# Patient Record
Sex: Female | Born: 1955 | Race: Asian | Hispanic: No | Marital: Married | Smoking: Never smoker
Health system: Southern US, Community
[De-identification: ages and names within clinical notes are randomized; demographics above are authoritative.]

## PROBLEM LIST (undated history)

## (undated) DIAGNOSIS — I10 Essential (primary) hypertension: Secondary | ICD-10-CM

## (undated) DIAGNOSIS — E785 Hyperlipidemia, unspecified: Secondary | ICD-10-CM

## (undated) DIAGNOSIS — K219 Gastro-esophageal reflux disease without esophagitis: Secondary | ICD-10-CM

## (undated) DIAGNOSIS — M810 Age-related osteoporosis without current pathological fracture: Secondary | ICD-10-CM

## (undated) DIAGNOSIS — J309 Allergic rhinitis, unspecified: Secondary | ICD-10-CM

## (undated) DIAGNOSIS — M8580 Other specified disorders of bone density and structure, unspecified site: Secondary | ICD-10-CM

## (undated) DIAGNOSIS — J342 Deviated nasal septum: Secondary | ICD-10-CM

## (undated) DIAGNOSIS — T7840XA Allergy, unspecified, initial encounter: Secondary | ICD-10-CM

## (undated) HISTORY — DX: Essential (primary) hypertension: I10

## (undated) HISTORY — DX: Allergy, unspecified, initial encounter: T78.40XA

## (undated) HISTORY — DX: Deviated nasal septum: J34.2

## (undated) HISTORY — DX: Other specified disorders of bone density and structure, unspecified site: M85.80

## (undated) HISTORY — DX: Allergic rhinitis, unspecified: J30.9

## (undated) HISTORY — DX: Gastro-esophageal reflux disease without esophagitis: K21.9

## (undated) HISTORY — DX: Hyperlipidemia, unspecified: E78.5

## (undated) HISTORY — DX: Age-related osteoporosis without current pathological fracture: M81.0

---

## 1988-11-18 HISTORY — PX: THYROIDECTOMY, PARTIAL: SHX18

## 1994-11-18 HISTORY — PX: TOTAL ABDOMINAL HYSTERECTOMY: SHX209

## 1998-01-04 ENCOUNTER — Ambulatory Visit (HOSPITAL_COMMUNITY): Admission: RE | Admit: 1998-01-04 | Discharge: 1998-01-04 | Payer: Self-pay | Admitting: Obstetrics and Gynecology

## 1998-10-04 ENCOUNTER — Other Ambulatory Visit: Admission: RE | Admit: 1998-10-04 | Discharge: 1998-10-04 | Payer: Self-pay | Admitting: Obstetrics and Gynecology

## 1999-05-15 ENCOUNTER — Encounter: Payer: Self-pay | Admitting: Obstetrics and Gynecology

## 1999-05-15 ENCOUNTER — Ambulatory Visit (HOSPITAL_COMMUNITY): Admission: RE | Admit: 1999-05-15 | Discharge: 1999-05-15 | Payer: Self-pay | Admitting: Obstetrics and Gynecology

## 1999-12-04 ENCOUNTER — Other Ambulatory Visit: Admission: RE | Admit: 1999-12-04 | Discharge: 1999-12-04 | Payer: Self-pay | Admitting: Obstetrics and Gynecology

## 2000-05-16 ENCOUNTER — Encounter: Payer: Self-pay | Admitting: Obstetrics and Gynecology

## 2000-05-16 ENCOUNTER — Ambulatory Visit (HOSPITAL_COMMUNITY): Admission: RE | Admit: 2000-05-16 | Discharge: 2000-05-16 | Payer: Self-pay | Admitting: Obstetrics and Gynecology

## 2001-05-26 ENCOUNTER — Encounter: Payer: Self-pay | Admitting: Obstetrics and Gynecology

## 2001-05-26 ENCOUNTER — Ambulatory Visit (HOSPITAL_COMMUNITY): Admission: RE | Admit: 2001-05-26 | Discharge: 2001-05-26 | Payer: Self-pay | Admitting: Obstetrics and Gynecology

## 2002-06-30 ENCOUNTER — Encounter: Payer: Self-pay | Admitting: Obstetrics and Gynecology

## 2002-06-30 ENCOUNTER — Ambulatory Visit (HOSPITAL_COMMUNITY): Admission: RE | Admit: 2002-06-30 | Discharge: 2002-06-30 | Payer: Self-pay | Admitting: Obstetrics and Gynecology

## 2003-06-24 ENCOUNTER — Ambulatory Visit (HOSPITAL_COMMUNITY): Admission: RE | Admit: 2003-06-24 | Discharge: 2003-06-24 | Payer: Self-pay | Admitting: Obstetrics and Gynecology

## 2003-06-24 ENCOUNTER — Encounter: Payer: Self-pay | Admitting: Obstetrics and Gynecology

## 2004-06-29 ENCOUNTER — Ambulatory Visit (HOSPITAL_COMMUNITY): Admission: RE | Admit: 2004-06-29 | Discharge: 2004-06-29 | Payer: Self-pay | Admitting: Obstetrics and Gynecology

## 2005-01-03 ENCOUNTER — Other Ambulatory Visit: Admission: RE | Admit: 2005-01-03 | Discharge: 2005-01-03 | Payer: Self-pay | Admitting: Obstetrics and Gynecology

## 2005-07-23 ENCOUNTER — Ambulatory Visit (HOSPITAL_COMMUNITY): Admission: RE | Admit: 2005-07-23 | Discharge: 2005-07-23 | Payer: Self-pay | Admitting: Obstetrics and Gynecology

## 2006-01-07 ENCOUNTER — Other Ambulatory Visit: Admission: RE | Admit: 2006-01-07 | Discharge: 2006-01-07 | Payer: Self-pay | Admitting: Obstetrics and Gynecology

## 2006-07-04 ENCOUNTER — Ambulatory Visit (HOSPITAL_COMMUNITY): Admission: RE | Admit: 2006-07-04 | Discharge: 2006-07-04 | Payer: Self-pay | Admitting: Obstetrics and Gynecology

## 2006-11-13 ENCOUNTER — Ambulatory Visit (HOSPITAL_COMMUNITY): Admission: RE | Admit: 2006-11-13 | Discharge: 2006-11-13 | Payer: Self-pay | Admitting: Obstetrics and Gynecology

## 2007-11-16 ENCOUNTER — Ambulatory Visit (HOSPITAL_COMMUNITY): Admission: RE | Admit: 2007-11-16 | Discharge: 2007-11-16 | Payer: Self-pay | Admitting: Obstetrics and Gynecology

## 2008-11-16 ENCOUNTER — Ambulatory Visit (HOSPITAL_COMMUNITY): Admission: RE | Admit: 2008-11-16 | Discharge: 2008-11-16 | Payer: Self-pay | Admitting: Obstetrics and Gynecology

## 2008-12-16 ENCOUNTER — Encounter: Admission: RE | Admit: 2008-12-16 | Discharge: 2008-12-16 | Payer: Self-pay | Admitting: Family Medicine

## 2009-04-04 ENCOUNTER — Encounter: Admission: RE | Admit: 2009-04-04 | Discharge: 2009-04-04 | Payer: Self-pay | Admitting: General Surgery

## 2009-06-28 ENCOUNTER — Encounter: Admission: RE | Admit: 2009-06-28 | Discharge: 2009-06-28 | Payer: Self-pay | Admitting: Endocrinology

## 2009-12-08 ENCOUNTER — Ambulatory Visit (HOSPITAL_COMMUNITY): Admission: RE | Admit: 2009-12-08 | Discharge: 2009-12-08 | Payer: Self-pay | Admitting: Obstetrics and Gynecology

## 2010-12-25 ENCOUNTER — Other Ambulatory Visit: Payer: Self-pay | Admitting: Endocrinology

## 2010-12-25 DIAGNOSIS — E049 Nontoxic goiter, unspecified: Secondary | ICD-10-CM

## 2010-12-27 ENCOUNTER — Other Ambulatory Visit (HOSPITAL_COMMUNITY): Payer: Self-pay | Admitting: Obstetrics and Gynecology

## 2010-12-27 ENCOUNTER — Ambulatory Visit
Admission: RE | Admit: 2010-12-27 | Discharge: 2010-12-27 | Disposition: A | Discharge status: Home / Self Care | Payer: 59 | Source: Ambulatory Visit | Attending: Endocrinology | Admitting: Endocrinology

## 2010-12-27 ENCOUNTER — Other Ambulatory Visit: Payer: Self-pay

## 2010-12-27 DIAGNOSIS — Z1231 Encounter for screening mammogram for malignant neoplasm of breast: Secondary | ICD-10-CM

## 2010-12-27 DIAGNOSIS — E049 Nontoxic goiter, unspecified: Secondary | ICD-10-CM

## 2011-01-10 ENCOUNTER — Other Ambulatory Visit: Payer: Self-pay | Admitting: Endocrinology

## 2011-01-10 DIAGNOSIS — E042 Nontoxic multinodular goiter: Secondary | ICD-10-CM

## 2011-01-16 ENCOUNTER — Ambulatory Visit
Admission: RE | Admit: 2011-01-16 | Discharge: 2011-01-16 | Disposition: A | Discharge status: Home / Self Care | Payer: 59 | Source: Ambulatory Visit | Attending: Endocrinology | Admitting: Endocrinology

## 2011-01-16 ENCOUNTER — Other Ambulatory Visit: Payer: Self-pay | Admitting: Interventional Radiology

## 2011-01-16 ENCOUNTER — Other Ambulatory Visit (HOSPITAL_COMMUNITY)
Admission: RE | Admit: 2011-01-16 | Discharge: 2011-01-16 | Disposition: A | Discharge status: Home / Self Care | Payer: 59 | Source: Ambulatory Visit | Attending: Interventional Radiology | Admitting: Interventional Radiology

## 2011-01-16 DIAGNOSIS — E049 Nontoxic goiter, unspecified: Secondary | ICD-10-CM | POA: Insufficient documentation

## 2011-01-16 DIAGNOSIS — E042 Nontoxic multinodular goiter: Secondary | ICD-10-CM

## 2011-01-21 ENCOUNTER — Ambulatory Visit (HOSPITAL_COMMUNITY)
Admission: RE | Admit: 2011-01-21 | Discharge: 2011-01-21 | Disposition: A | Discharge status: Home / Self Care | Payer: 59 | Source: Ambulatory Visit | Attending: Obstetrics and Gynecology | Admitting: Obstetrics and Gynecology

## 2011-01-21 DIAGNOSIS — Z1231 Encounter for screening mammogram for malignant neoplasm of breast: Secondary | ICD-10-CM

## 2011-12-24 ENCOUNTER — Other Ambulatory Visit: Payer: Self-pay | Admitting: Endocrinology

## 2011-12-24 DIAGNOSIS — E049 Nontoxic goiter, unspecified: Secondary | ICD-10-CM

## 2011-12-25 ENCOUNTER — Other Ambulatory Visit (HOSPITAL_COMMUNITY): Payer: Self-pay | Admitting: Obstetrics and Gynecology

## 2011-12-25 DIAGNOSIS — Z1231 Encounter for screening mammogram for malignant neoplasm of breast: Secondary | ICD-10-CM

## 2011-12-27 ENCOUNTER — Ambulatory Visit
Admission: RE | Admit: 2011-12-27 | Discharge: 2011-12-27 | Disposition: A | Discharge status: Home / Self Care | Payer: 59 | Source: Ambulatory Visit | Attending: Endocrinology | Admitting: Endocrinology

## 2011-12-27 DIAGNOSIS — E049 Nontoxic goiter, unspecified: Secondary | ICD-10-CM

## 2012-01-24 ENCOUNTER — Ambulatory Visit (HOSPITAL_COMMUNITY): Payer: 59

## 2012-01-24 ENCOUNTER — Ambulatory Visit (HOSPITAL_COMMUNITY)
Admission: RE | Admit: 2012-01-24 | Discharge: 2012-01-24 | Disposition: A | Discharge status: Home / Self Care | Payer: 59 | Source: Ambulatory Visit | Attending: Obstetrics and Gynecology | Admitting: Obstetrics and Gynecology

## 2012-01-24 DIAGNOSIS — Z1231 Encounter for screening mammogram for malignant neoplasm of breast: Secondary | ICD-10-CM | POA: Insufficient documentation

## 2012-01-31 ENCOUNTER — Ambulatory Visit (HOSPITAL_COMMUNITY): Payer: 59

## 2012-02-11 IMAGING — US US THYROID BIOPSY
1 series · 14 of 18 positions shown · non-contrast
Comparison: none

CLINICAL DATA: Bilateral thyroid nodules.

ULTRASOUND-GUIDED THYROID ASPIRATION BIOPSY RIGHT
TECHNIQUE: Survey ultrasound was performed and the dominant lesion
in the inferomedial right lobe was localized.  An appropriate skin
entry site was determined.  Skin was marked, then prepped with
Betadine, draped in usual sterile fashion, and infiltrated locally
with 1% lidocaine.  Under real-time ultrasound guidance, 4  passes
were made into the lesion with 25 gauge needles.
IMPRESSION
1.  Technically successful ultrasound-guided thyroid aspiration
biopsy
.
ULTRASOUND-GUIDED THYROID ASPIRATION BIOPSY LEFT
TECHNIQUE: Survey ultrasound was performed and the dominant
complex lesion in the mid left lobe was localized.  An appropriate
skin entry site was determined.     Under real-time ultrasound
guidance, 4  passes were made into the lesion with 25 gauge
needles.  The patient tolerated procedure well, with no immediate
complications.

[Series 1: us thyroid biopsy · 0.05mm/px · 18 acquisitions, 14 frames shown]
[im 1/18]
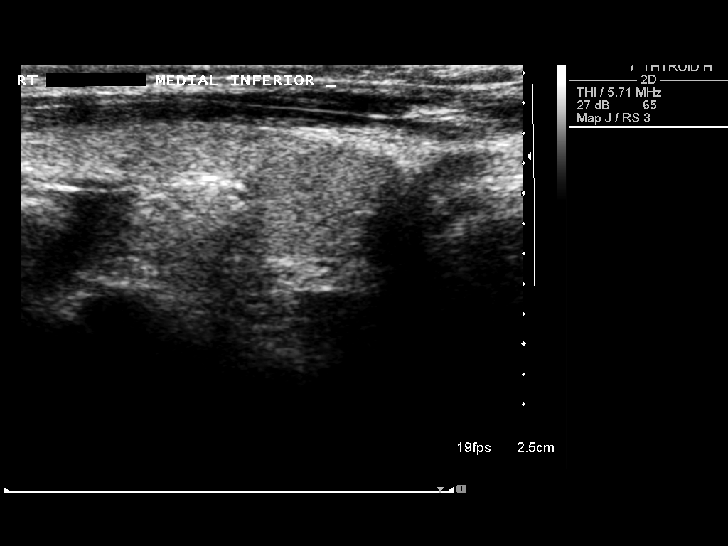
[im 2/18]
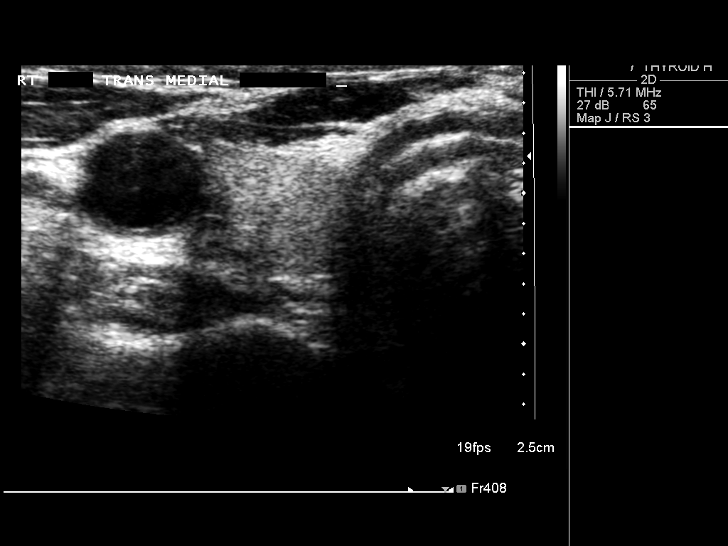
[im 4/18]
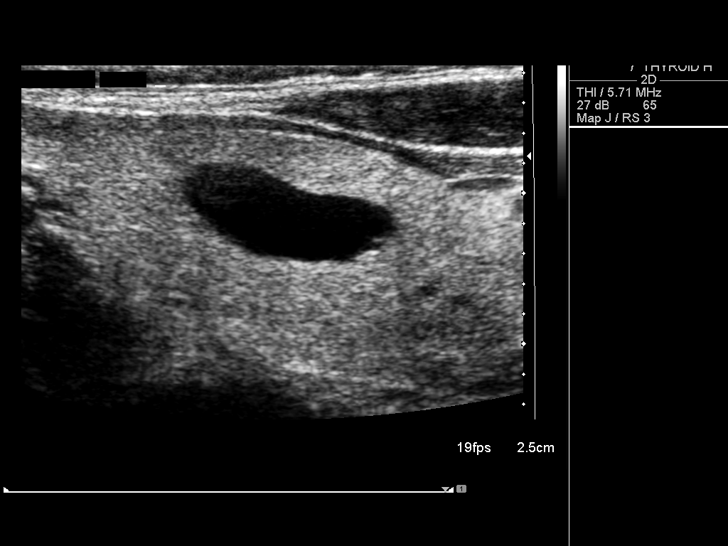
[im 5/18]
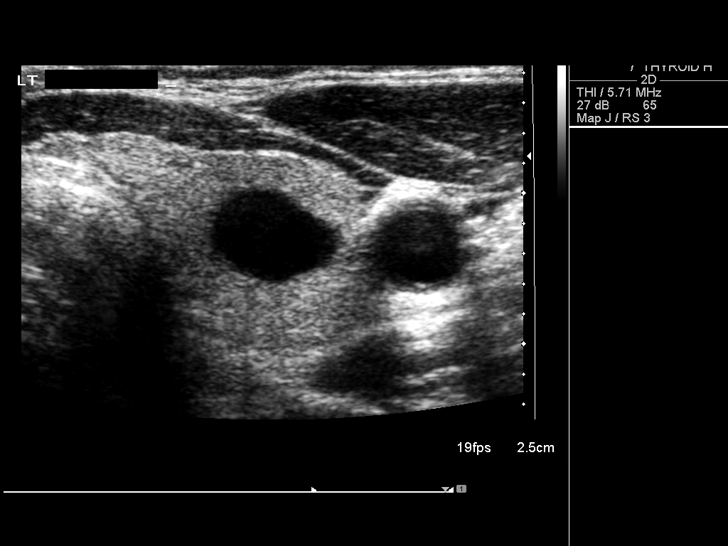
[im 6/18]
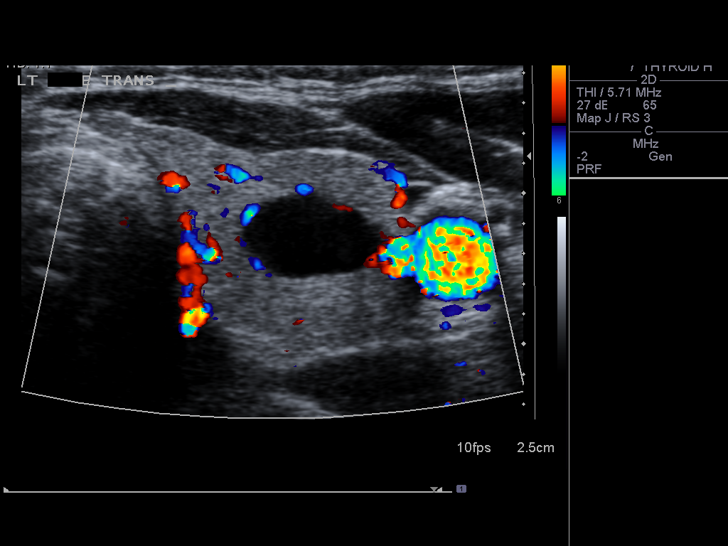
[im 8/18]
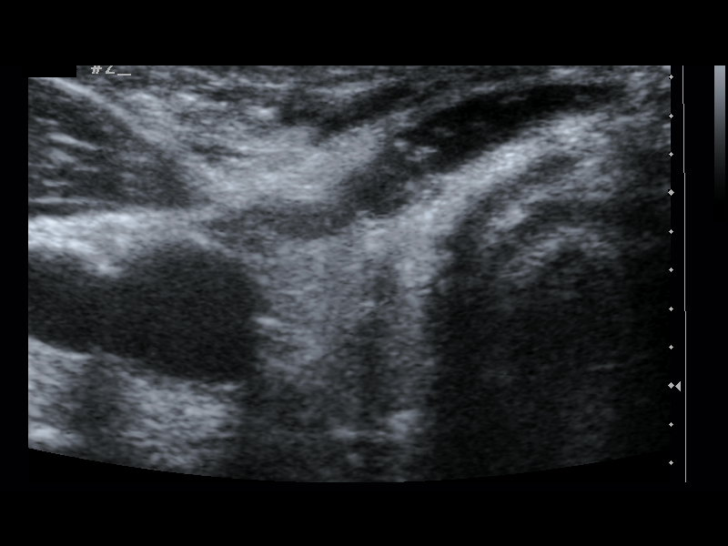
[im 9/18]
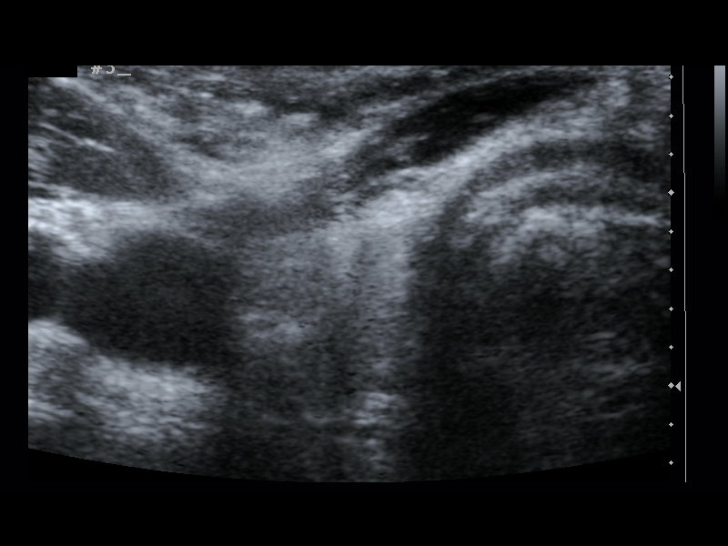
[im 10/18]
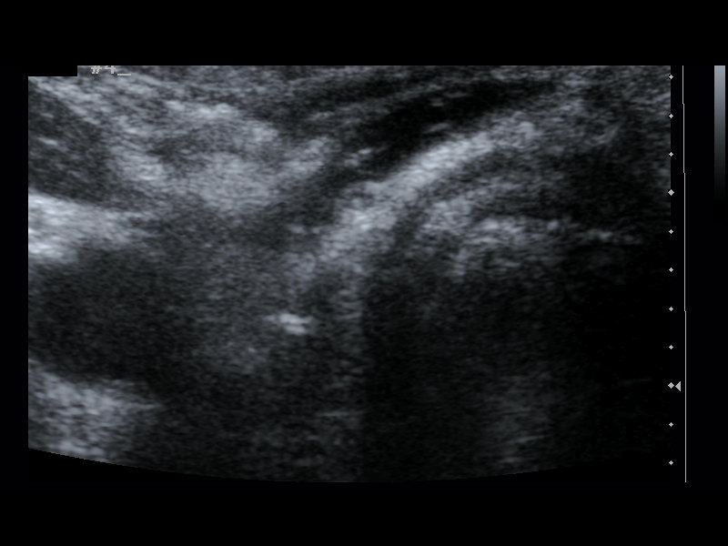
[im 11/18]
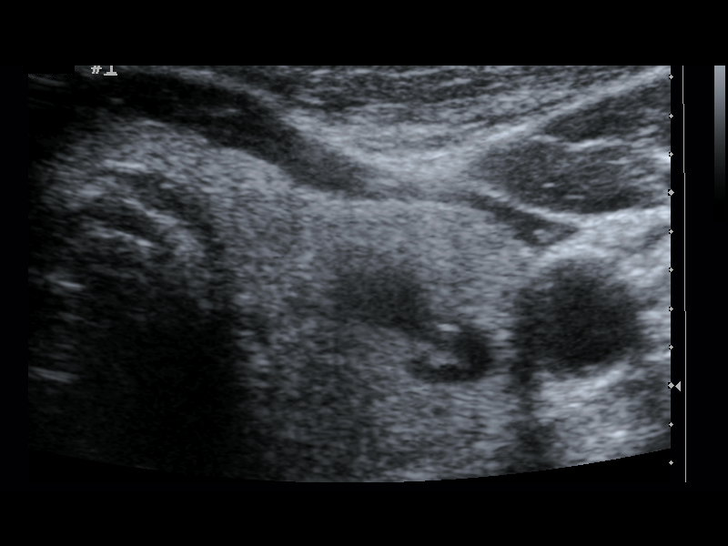
[im 13/18]
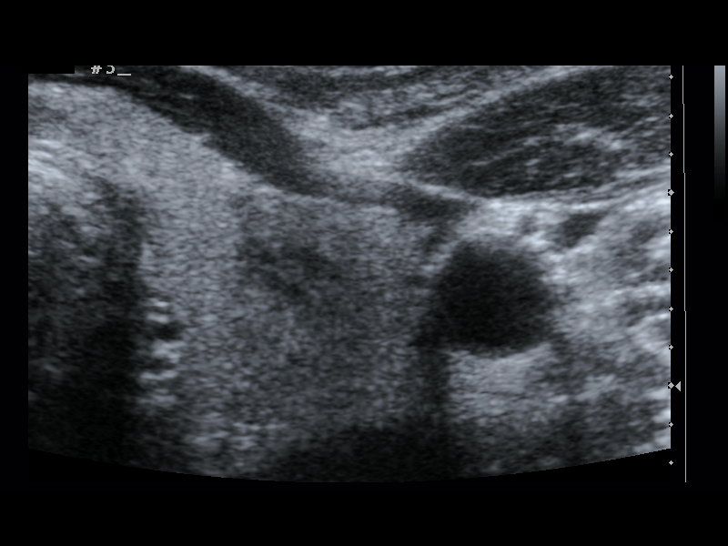
[im 14/18]
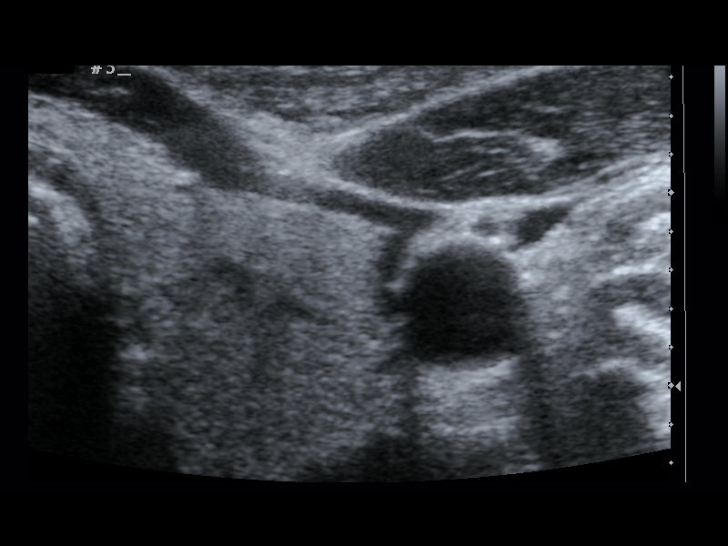
[im 15/18]
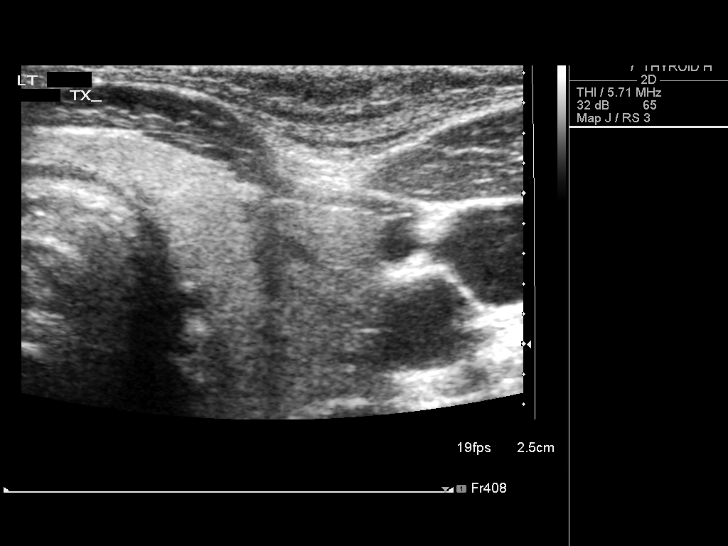
[im 17/18]
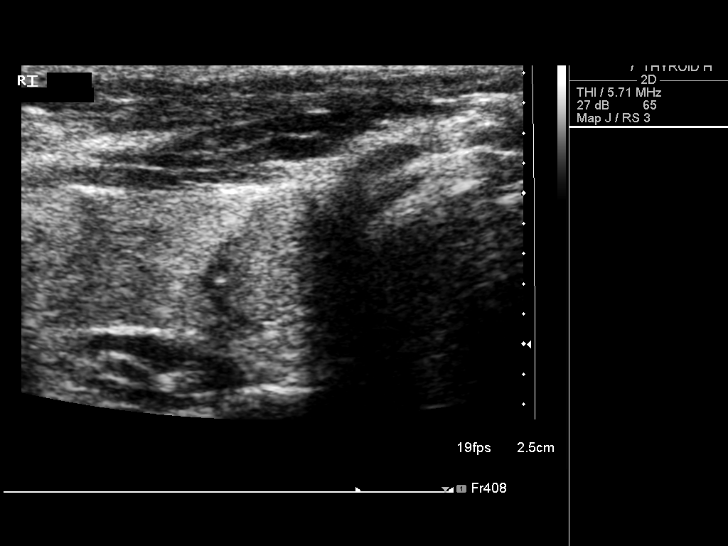
[im 18/18]
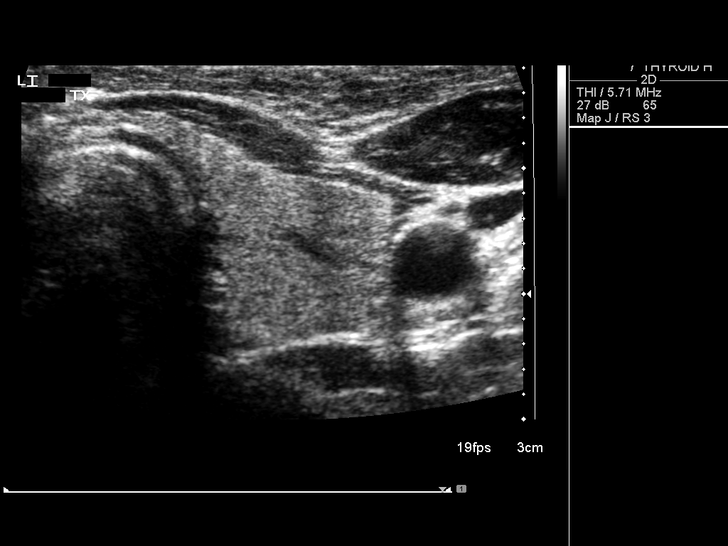

[14 of 18 positions shown; findings below may reference images not displayed]

## 2012-03-20 ENCOUNTER — Encounter: Payer: Self-pay | Admitting: Gastroenterology

## 2012-03-20 ENCOUNTER — Other Ambulatory Visit (HOSPITAL_COMMUNITY): Payer: Self-pay | Admitting: Nurse Practitioner

## 2012-03-20 DIAGNOSIS — IMO0002 Reserved for concepts with insufficient information to code with codable children: Secondary | ICD-10-CM

## 2012-04-03 ENCOUNTER — Ambulatory Visit (HOSPITAL_COMMUNITY)
Admission: RE | Admit: 2012-04-03 | Discharge: 2012-04-03 | Disposition: A | Discharge status: Home / Self Care | Payer: 59 | Source: Ambulatory Visit | Attending: Nurse Practitioner | Admitting: Nurse Practitioner

## 2012-04-03 DIAGNOSIS — IMO0002 Reserved for concepts with insufficient information to code with codable children: Secondary | ICD-10-CM | POA: Insufficient documentation

## 2012-04-03 DIAGNOSIS — Z13820 Encounter for screening for osteoporosis: Secondary | ICD-10-CM | POA: Insufficient documentation

## 2012-06-05 ENCOUNTER — Ambulatory Visit (AMBULATORY_SURGERY_CENTER): Payer: 59 | Admitting: *Deleted

## 2012-06-05 VITALS — Ht 61.0 in | Wt 103.0 lb

## 2012-06-05 DIAGNOSIS — Z1211 Encounter for screening for malignant neoplasm of colon: Secondary | ICD-10-CM

## 2012-06-05 MED ORDER — MOVIPREP 100 G PO SOLR: ORAL | Status: DC

## 2012-06-08 ENCOUNTER — Encounter: Payer: Self-pay | Admitting: Gastroenterology

## 2012-06-12 ENCOUNTER — Ambulatory Visit (AMBULATORY_SURGERY_CENTER): Payer: 59 | Admitting: Gastroenterology

## 2012-06-12 ENCOUNTER — Encounter: Payer: Self-pay | Admitting: Gastroenterology

## 2012-06-12 VITALS — BP 130/75 | HR 73 | Temp 97.2°F | Resp 17 | Ht 61.0 in | Wt 103.0 lb

## 2012-06-12 DIAGNOSIS — Z1211 Encounter for screening for malignant neoplasm of colon: Secondary | ICD-10-CM

## 2012-06-12 MED ORDER — SODIUM CHLORIDE 0.9 % IV SOLN: 500.0000 mL | INTRAVENOUS | Status: DC

## 2012-06-12 NOTE — Progress Notes (Signed)
Patient did not have preoperative order for IV antibiotic SSI prophylaxis. (G8918)  Patient did not experience any of the following events: a burn prior to discharge; a fall within the facility; wrong site/side/patient/procedure/implant event; or a hospital transfer or hospital admission upon discharge from the facility. (G8907)  

## 2012-06-12 NOTE — Op Note (Signed)
[facility] [address] [place], [place]  [address]  COLONOSCOPY PROCEDURE REPORT  PATIENT:  Robertson, Katrina  MR#:  [id number] BIRTHDATE:  1956/08/16, 55 yrs. old  GENDER:  female ENDOSCOPIST:  Rachael Fee, MD REF. BY:  Juluis Rainier, M.D. PROCEDURE DATE:  06/12/2012 PROCEDURE:  Colonoscopy 19147 ASA CLASS:  Class II INDICATIONS:  Routine Risk Screening MEDICATIONS:   Fentanyl 50 mcg IV, These medications were titrated to patient response per physician's verbal order, Versed 4 mg IV  DESCRIPTION OF PROCEDURE:   After the risks benefits and alternatives of the procedure were thoroughly explained, informed consent was obtained.  Digital rectal exam was performed and revealed no rectal masses.   The LB [id number]-[id number] endoscope was introduced through the anus and advanced to the cecum, which was identified by both the appendix and ileocecal valve, without limitations.  The quality of the prep was good..  The instrument was then slowly withdrawn as the colon was fully examined. <<PROCEDUREIMAGES>> FINDINGS:  A normal appearing cecum, ileocecal valve, and appendiceal orifice were identified. The ascending, hepatic flexure, transverse, splenic flexure, descending, sigmoid colon, and rectum appeared unremarkable (see image1, image2, and image3). Retroflexed views Gwen the rectum revealed no abnormalities. COMPLICATIONS:  None  ENDOSCOPIC IMPRESSION: 1) Normal colon 2. No polyps or cancers  RECOMMENDATIONS: 1) You should continue to follow colorectal cancer screening guidelines for "routine risk" patients with a repeat colonoscopy Brennley 10 years. There is no need for FOBT (stool) testing for at least 5 years.  REPEAT EXAM:  10 years  ______________________________ Rachael Fee, MD  n. eSIGNED:   Rachael Fee at 06/12/2012 01:09 PM  [place], [place], [phone]

## 2012-06-12 NOTE — Patient Instructions (Addendum)
YOU HAD AN ENDOSCOPIC PROCEDURE TODAY AT THE [facility]: Refer to the procedure report that was given to you for any specific questions about what was found during the examination.  If the procedure report does not answer your questions, please call your gastroenterologist to clarify.  If you requested that your care partner not be given the details of your procedure findings, then the procedure report has been included Harmani a sealed envelope for you to review at your convenience later.  YOU SHOULD EXPECT: Some feelings of bloating Hebe the abdomen. Passage of more gas than usual.  Walking can help get rid of the air that was put into your GI tract during the procedure and reduce the bloating. If you had a lower endoscopy (such as a colonoscopy or flexible sigmoidoscopy) you may notice spotting of blood Nilani your stool or on the toilet paper. If you underwent a bowel prep for your procedure, then you may not have a normal bowel movement for a few days.  DIET: Your first meal following the procedure should be a light meal and then it is ok to progress to your normal diet.  A half-sandwich or bowl of soup is an example of a good first meal.  Heavy or fried foods are harder to digest and may make you feel nauseous or bloated.  Likewise meals heavy Valina dairy and vegetables can cause extra gas to form and this can also increase the bloating.  Drink plenty of fluids but you should avoid alcoholic beverages for 24 hours.  ACTIVITY: Your care partner should take you home directly after the procedure.  You should plan to take it easy, moving slowly for the rest of the day.  You can resume normal activity the day after the procedure however you should NOT DRIVE or use heavy machinery for 24 hours (because of the sedation medicines used during the test).    SYMPTOMS TO REPORT IMMEDIATELY: A gastroenterologist can be reached at any hour.  During normal business hours, 8:30 AM to 5:00 PM Monday through Friday,  call [phone].  After hours and on weekends, please call the GI answering service at [phone] who will take a message and have the physician on call contact you.   Following lower endoscopy (colonoscopy or flexible sigmoidoscopy):  Excessive amounts of blood Audrey the stool  Significant tenderness or worsening of abdominal pains  Swelling of the abdomen that is new, acute  Fever of 100F or higher  Following upper endoscopy (EGD)  Vomiting of blood or coffee ground material  New chest pain or pain under the shoulder blades  Painful or persistently difficult swallowing  New shortness of breath  Fever of 100F or higher  Black, tarry-looking stools  FOLLOW UP: If any biopsies were taken you will be contacted by phone or by letter within the next 1-3 weeks.  Call your gastroenterologist if you have not heard about the biopsies Avleen 3 weeks.  Our staff will call the home number listed on your records the next business day following your procedure to check on you and address any questions or concerns that you may have at that time regarding the information given to you following your procedure. This is a courtesy call and so if there is no answer at the home number and we have not heard from you through the emergency physician on call, we will assume that you have returned to your regular daily activities without incident.  SIGNATURES/CONFIDENTIALITY: You and/or your care   partner have signed paperwork which will be entered into your electronic medical record.  These signatures attest to the fact that that the information above on your After Visit Summary has been reviewed and is understood.  Full responsibility of the confidentiality of this discharge information lies with you and/or your care-partner.  

## 2012-06-12 NOTE — Progress Notes (Signed)
The pt tolerated the colonoscopy very well. Maw   

## 2012-06-15 ENCOUNTER — Telehealth: Payer: Self-pay | Admitting: *Deleted

## 2012-06-15 NOTE — Telephone Encounter (Signed)
  Follow up Call-  Call back number 06/12/2012  Post procedure Call Back phone  # [phone] cell  Permission to leave phone message Yes     Patient questions:  Do you have a fever, pain , or abdominal swelling? no Pain Score  0 *  Have you tolerated food without any problems? yes  Have you been able to return to your normal activities? yes  Do you have any questions about your discharge instructions: Diet   no Medications  no Follow up visit  no  Do you have questions or concerns about your Care? no  Actions: * If pain score is 4 or above: No action needed, pain <4.

## 2013-01-13 ENCOUNTER — Other Ambulatory Visit (HOSPITAL_COMMUNITY): Payer: Self-pay | Admitting: Obstetrics and Gynecology

## 2013-01-13 DIAGNOSIS — Z1231 Encounter for screening mammogram for malignant neoplasm of breast: Secondary | ICD-10-CM

## 2013-01-29 ENCOUNTER — Ambulatory Visit (HOSPITAL_COMMUNITY)
Admission: RE | Admit: 2013-01-29 | Discharge: 2013-01-29 | Disposition: A | Discharge status: Home / Self Care | Payer: 59 | Source: Ambulatory Visit | Attending: Obstetrics and Gynecology | Admitting: Obstetrics and Gynecology

## 2013-01-29 DIAGNOSIS — Z1231 Encounter for screening mammogram for malignant neoplasm of breast: Secondary | ICD-10-CM | POA: Insufficient documentation

## 2013-09-23 ENCOUNTER — Other Ambulatory Visit: Payer: Self-pay | Admitting: Neurological Surgery

## 2013-09-23 DIAGNOSIS — M5416 Radiculopathy, lumbar region: Secondary | ICD-10-CM

## 2013-10-01 ENCOUNTER — Ambulatory Visit
Admission: RE | Admit: 2013-10-01 | Discharge: 2013-10-01 | Disposition: A | Discharge status: Home / Self Care | Payer: 59 | Source: Ambulatory Visit | Attending: Neurological Surgery | Admitting: Neurological Surgery

## 2013-10-01 VITALS — BP 107/67 | HR 55

## 2013-10-01 DIAGNOSIS — M5416 Radiculopathy, lumbar region: Secondary | ICD-10-CM

## 2013-10-01 MED ORDER — DIAZEPAM 2 MG PO TABS
2.0000 mg | ORAL_TABLET | Freq: Once | ORAL | Status: AC
  Administered 2013-10-01: 2 mg via ORAL

## 2013-10-01 MED ORDER — IOHEXOL 180 MG/ML  SOLN
15.0000 mL | Freq: Once | INTRAMUSCULAR | Status: AC | PRN
  Administered 2013-10-01: 15 mL via INTRAVENOUS

## 2014-01-31 ENCOUNTER — Other Ambulatory Visit (HOSPITAL_COMMUNITY): Payer: Self-pay | Admitting: Obstetrics and Gynecology

## 2014-01-31 DIAGNOSIS — Z1231 Encounter for screening mammogram for malignant neoplasm of breast: Secondary | ICD-10-CM

## 2014-02-04 ENCOUNTER — Ambulatory Visit (HOSPITAL_COMMUNITY): Payer: 59

## 2014-02-11 ENCOUNTER — Ambulatory Visit (HOSPITAL_COMMUNITY)
Admission: RE | Admit: 2014-02-11 | Discharge: 2014-02-11 | Disposition: A | Discharge status: Home / Self Care | Payer: 59 | Source: Ambulatory Visit | Attending: Obstetrics and Gynecology | Admitting: Obstetrics and Gynecology

## 2014-02-11 DIAGNOSIS — Z1231 Encounter for screening mammogram for malignant neoplasm of breast: Secondary | ICD-10-CM

## 2014-03-24 ENCOUNTER — Other Ambulatory Visit (HOSPITAL_COMMUNITY): Payer: Self-pay | Admitting: Obstetrics and Gynecology

## 2014-03-24 DIAGNOSIS — M8580 Other specified disorders of bone density and structure, unspecified site: Secondary | ICD-10-CM

## 2014-04-08 ENCOUNTER — Ambulatory Visit (HOSPITAL_COMMUNITY)
Admission: RE | Admit: 2014-04-08 | Discharge: 2014-04-08 | Disposition: A | Discharge status: Home / Self Care | Payer: 59 | Source: Ambulatory Visit | Attending: Obstetrics and Gynecology | Admitting: Obstetrics and Gynecology

## 2014-04-08 DIAGNOSIS — Z780 Asymptomatic menopausal state: Secondary | ICD-10-CM | POA: Insufficient documentation

## 2014-04-08 DIAGNOSIS — M8580 Other specified disorders of bone density and structure, unspecified site: Secondary | ICD-10-CM

## 2014-04-08 DIAGNOSIS — M899 Disorder of bone, unspecified: Secondary | ICD-10-CM | POA: Insufficient documentation

## 2014-04-08 DIAGNOSIS — M949 Disorder of cartilage, unspecified: Secondary | ICD-10-CM

## 2014-04-08 DIAGNOSIS — Z13820 Encounter for screening for osteoporosis: Secondary | ICD-10-CM | POA: Insufficient documentation

## 2015-01-09 ENCOUNTER — Other Ambulatory Visit: Payer: Self-pay | Admitting: Endocrinology

## 2015-01-09 DIAGNOSIS — E049 Nontoxic goiter, unspecified: Secondary | ICD-10-CM

## 2015-01-13 ENCOUNTER — Ambulatory Visit
Admission: RE | Admit: 2015-01-13 | Discharge: 2015-01-13 | Disposition: A | Discharge status: Home / Self Care | Payer: 59 | Source: Ambulatory Visit | Attending: Endocrinology | Admitting: Endocrinology

## 2015-01-13 DIAGNOSIS — E049 Nontoxic goiter, unspecified: Secondary | ICD-10-CM

## 2015-02-06 ENCOUNTER — Other Ambulatory Visit (HOSPITAL_COMMUNITY): Payer: Self-pay | Admitting: Obstetrics and Gynecology

## 2015-02-06 DIAGNOSIS — Z1231 Encounter for screening mammogram for malignant neoplasm of breast: Secondary | ICD-10-CM

## 2015-02-17 ENCOUNTER — Ambulatory Visit (HOSPITAL_COMMUNITY)
Admission: RE | Admit: 2015-02-17 | Discharge: 2015-02-17 | Disposition: A | Discharge status: Home / Self Care | Payer: 59 | Source: Ambulatory Visit | Attending: Obstetrics and Gynecology | Admitting: Obstetrics and Gynecology

## 2015-02-17 ENCOUNTER — Ambulatory Visit (HOSPITAL_COMMUNITY): Payer: 59

## 2015-02-17 DIAGNOSIS — Z1231 Encounter for screening mammogram for malignant neoplasm of breast: Secondary | ICD-10-CM | POA: Diagnosis present

## 2016-01-15 ENCOUNTER — Other Ambulatory Visit: Payer: Self-pay | Admitting: Endocrinology

## 2016-01-15 DIAGNOSIS — E049 Nontoxic goiter, unspecified: Secondary | ICD-10-CM

## 2016-01-18 ENCOUNTER — Ambulatory Visit
Admission: RE | Admit: 2016-01-18 | Discharge: 2016-01-18 | Disposition: A | Discharge status: Home / Self Care | Payer: 59 | Source: Ambulatory Visit | Attending: Endocrinology | Admitting: Endocrinology

## 2016-01-18 DIAGNOSIS — E049 Nontoxic goiter, unspecified: Secondary | ICD-10-CM

## 2016-01-19 ENCOUNTER — Other Ambulatory Visit: Payer: 59

## 2016-02-22 ENCOUNTER — Other Ambulatory Visit: Payer: Self-pay

## 2016-02-22 DIAGNOSIS — Z1231 Encounter for screening mammogram for malignant neoplasm of breast: Secondary | ICD-10-CM

## 2016-03-15 ENCOUNTER — Ambulatory Visit: Payer: 59

## 2016-03-15 ENCOUNTER — Ambulatory Visit
Admission: RE | Admit: 2016-03-15 | Discharge: 2016-03-15 | Disposition: A | Discharge status: Home / Self Care | Payer: 59 | Source: Ambulatory Visit

## 2016-03-15 DIAGNOSIS — Z1231 Encounter for screening mammogram for malignant neoplasm of breast: Secondary | ICD-10-CM

## 2016-06-21 ENCOUNTER — Ambulatory Visit (INDEPENDENT_AMBULATORY_CARE_PROVIDER_SITE_OTHER): Payer: 59 | Admitting: Diagnostic Neuroimaging

## 2016-06-21 ENCOUNTER — Encounter: Payer: Self-pay | Admitting: Diagnostic Neuroimaging

## 2016-06-21 VITALS — BP 121/71 | HR 62 | Ht 61.0 in | Wt 101.0 lb

## 2016-06-21 DIAGNOSIS — R42 Dizziness and giddiness: Secondary | ICD-10-CM

## 2016-06-21 NOTE — Progress Notes (Addendum)
[facility]  PATIENT: Katrina Robertson DOB: 18-Nov-1956  REFERRING CLINICIAN: Catha Gosselin, MD HISTORY FROM: patient  REASON FOR VISIT: new consult    HISTORICAL  CHIEF COMPLAINT:  Chief Complaint  Patient presents with  . Dizziness    rm 6, New Pt, "2 episodes on dizziness at 3 am; 1st one lasted until morning, 2nd lasted all day, asscoc w/nausea"     HISTORY OF PRESENT ILLNESS:   60 year old female here for evaluation of dizzy spells.   April 2017 patient woke up at 3:00 Katrina Robertson the morning and felt slightly dizzy. She got up to go to the bathroom and when she take a first step she felt like she was able lose her balance. No nausea or vomiting. Patient was able to make the bathroom and returned to bed. By the next morning she felt back to baseline. She does not recall any numbness, weakness, slurred speech, trouble talking, vision changes or headaches.  June 2017 patient had another episode, again waking up at 2:00 Katrina Robertson the morning to use the bathroom. This time when she woke up she felt dizziness sensation, stood up and almost lost her balance. Again no associated symptoms of numbness, weakness, headache, double vision, slurred speech. The next morning her symptoms continued with a "dizzy feeling Katrina Robertson the head" without spinning sensation. She went to work and by the end of the day her symptoms had resolved.  Patient went to PCP who would did additional testing including MRI of the brain, MRA of the head and neck, which were unremarkable.  Today patient is a symptomatically. She feels well. No recurrent symptoms. Patient denies any ringing Katrina Robertson ears, hearing loss or any other ear problems. Patient does recall that 2017 has been a more difficult year regarding her seasonal allergies, especially Katrina Robertson the April and June timeframes.    REVIEW OF SYSTEMS: Full 14 system review of systems performed and negative with exception of: Restless legs not asleep allergies  constipation.  ALLERGIES: Allergies  Allergen Reactions  . Sulfa Antibiotics Nausea And Vomiting    HOME MEDICATIONS: Outpatient Medications Prior to Visit  Medication Sig Dispense Refill  . Calcium Carbonate (CALCIUM 600 PO) Take by mouth 2 (two) times daily.    . metoprolol (LOPRESSOR) 50 MG tablet Take 50 mg by mouth 2 (two) times daily. Takes 1/2 tablet twice daily    . Multiple Vitamin (MULTIVITAMIN) tablet Take 1 tablet by mouth daily.    Marland Kitchen VIVELLE-DOT 0.0375 MG/24HR Place 1 patch onto the skin once a week.      Facility-Administered Medications Prior to Visit  Medication Dose Route Frequency Provider Last Rate Last Dose  . 0.9 %  sodium chloride infusion  500 mL Intravenous Continuous Rachael Fee, MD        PAST MEDICAL HISTORY: Past Medical History:  Diagnosis Date  . Hypertension   . Osteopenia     PAST SURGICAL HISTORY: Past Surgical History:  Procedure Laterality Date  . THYROIDECTOMY, PARTIAL  1990  . TOTAL ABDOMINAL HYSTERECTOMY  1996    FAMILY HISTORY: Family History  Problem Relation Age of Onset  . Stroke Father   . Cancer Sister   . Colon cancer Neg Hx   . Stomach cancer Neg Hx   . Esophageal cancer Neg Hx   . Rectal cancer Neg Hx     SOCIAL HISTORY:  Social History   Social History  . Marital status: Married    Spouse name: N/A  . Number of  children: 1  . Years of education: 85   Occupational History  .      dental technician   Social History Main Topics  . Smoking status: Never Smoker  . Smokeless tobacco: Never Used  . Alcohol use No  . Drug use: No  . Sexual activity: Not on file   Other Topics Concern  . Not on file   Social History Narrative   Lives with husband    caffeine- coffee 1 cup daily     PHYSICAL EXAM  GENERAL EXAM/CONSTITUTIONAL: Vitals:  Vitals:   06/21/16 0947  BP: 121/71  Pulse: 62  Weight: 101 lb (45.8 kg)  Height: 5\' 1"  (1.549 m)    Orthostatic VS for the past 24 hrs (Last 3 readings):   BP- Lying Pulse- Lying BP- Sitting Pulse- Sitting BP- Standing at 0 minutes Pulse- Standing at 0 minutes  06/21/16 1004 122/69 64 121/71 62 117/73 62       Body mass index is 19.08 kg/m.  No exam data present  Patient is Katrina Robertson no distress; well developed, nourished and groomed; neck is supple  CARDIOVASCULAR:  Examination of carotid arteries is normal; no carotid bruits  Regular rate and rhythm, no murmurs  Examination of peripheral vascular system by observation and palpation is normal  EYES:  Ophthalmoscopic exam of optic discs and posterior segments is normal; no papilledema or hemorrhages  MUSCULOSKELETAL:  Gait, strength, tone, movements noted Katrina Robertson Neurologic exam below  NEUROLOGIC: MENTAL STATUS:  No flowsheet data found.  awake, alert, oriented to person, place and time  recent and remote memory intact  normal attention and concentration  language fluent, comprehension intact, naming intact,   fund of knowledge appropriate  CRANIAL NERVE:   2nd - no papilledema on fundoscopic exam  2nd, 3rd, 4th, 6th - pupils equal and reactive to light, visual fields full to confrontation, extraocular muscles intact, no nystagmus  5th - facial sensation symmetric  7th - facial strength symmetric  8th - hearing intact  9th - palate elevates symmetrically, uvula midline  11th - shoulder shrug symmetric  12th - tongue protrusion midline  MOTOR:   normal bulk and tone, full strength Katrina Robertson the BUE, BLE  SENSORY:   normal and symmetric to light touch, temperature, vibration  COORDINATION:   finger-nose-finger, fine finger movements normal  REFLEXES:   deep tendon reflexes present and symmetric  GAIT/STATION:   narrow based gait; able to walk tandem; romberg is negative    DIAGNOSTIC DATA (LABS, IMAGING, TESTING) - I reviewed patient records, labs, notes, testing and imaging myself where available.  No results found for: WBC, HGB, HCT, MCV, PLT No  results found for: NA, K, CL, CO2, GLUCOSE, BUN, CREATININE, CALCIUM, PROT, ALBUMIN, AST, ALT, ALKPHOS, BILITOT, GFRNONAA, GFRAA No results found for: CHOL, HDL, LDLCALC, LDLDIRECT, TRIG, CHOLHDL No results found for: ACZY6A No results found for: VITAMINB12 No results found for: TSH  05/24/16 MRI brain [report paraphrased below] - Few small discrete white matter lesions identified. Findings are non-specific and considerations include autoimmune, inflammatory, post-infectious, microvascular ischemic or migraine associated etiologies.   05/24/16 MRA neck [report only] - normal     ASSESSMENT AND PLAN  60 y.o. year old female here with 2 episodes of unprovoked dizzy sensation Dior the head associated with loss of balance. No other associated symptoms. MRI brain and MRA neck unremarkable. Apparently MRA head was also unremarkable although I do not have this report to review directly myself today. Overall patient is doing  well. May have represented peripheral vestibulopathy related to excessive seasonal allergies.  Dx:  1. Dizziness and giddiness      PLAN: - Reassured patient and will monitor symptoms; no other testing or treatment advised this time  Return if symptoms worsen or fail to improve, for return to PCP.    Suanne Marker, MD 06/21/2016, 5:29 PM Certified Jude Neurology, Neurophysiology and Neuroimaging  [facility] [address] [place], [place] [address] [phone]

## 2016-06-21 NOTE — Patient Instructions (Signed)

## 2016-08-20 ENCOUNTER — Other Ambulatory Visit: Payer: Self-pay | Admitting: Obstetrics and Gynecology

## 2016-08-20 DIAGNOSIS — E2839 Other primary ovarian failure: Secondary | ICD-10-CM

## 2016-11-22 DIAGNOSIS — J3089 Other allergic rhinitis: Secondary | ICD-10-CM | POA: Diagnosis not present

## 2016-11-22 DIAGNOSIS — J301 Allergic rhinitis due to pollen: Secondary | ICD-10-CM | POA: Diagnosis not present

## 2016-11-29 DIAGNOSIS — J301 Allergic rhinitis due to pollen: Secondary | ICD-10-CM | POA: Diagnosis not present

## 2016-11-29 DIAGNOSIS — J3089 Other allergic rhinitis: Secondary | ICD-10-CM | POA: Diagnosis not present

## 2016-12-06 DIAGNOSIS — J3089 Other allergic rhinitis: Secondary | ICD-10-CM | POA: Diagnosis not present

## 2016-12-06 DIAGNOSIS — J301 Allergic rhinitis due to pollen: Secondary | ICD-10-CM | POA: Diagnosis not present

## 2016-12-13 DIAGNOSIS — J301 Allergic rhinitis due to pollen: Secondary | ICD-10-CM | POA: Diagnosis not present

## 2016-12-13 DIAGNOSIS — J3089 Other allergic rhinitis: Secondary | ICD-10-CM | POA: Diagnosis not present

## 2016-12-16 DIAGNOSIS — J3089 Other allergic rhinitis: Secondary | ICD-10-CM | POA: Diagnosis not present

## 2016-12-19 DIAGNOSIS — J301 Allergic rhinitis due to pollen: Secondary | ICD-10-CM | POA: Diagnosis not present

## 2016-12-19 DIAGNOSIS — J3089 Other allergic rhinitis: Secondary | ICD-10-CM | POA: Diagnosis not present

## 2016-12-27 DIAGNOSIS — H40022 Open angle with borderline findings, high risk, left eye: Secondary | ICD-10-CM | POA: Diagnosis not present

## 2016-12-27 DIAGNOSIS — J301 Allergic rhinitis due to pollen: Secondary | ICD-10-CM | POA: Diagnosis not present

## 2016-12-27 DIAGNOSIS — J3089 Other allergic rhinitis: Secondary | ICD-10-CM | POA: Diagnosis not present

## 2016-12-27 DIAGNOSIS — H04123 Dry eye syndrome of bilateral lacrimal glands: Secondary | ICD-10-CM | POA: Diagnosis not present

## 2016-12-27 DIAGNOSIS — H401111 Primary open-angle glaucoma, right eye, mild stage: Secondary | ICD-10-CM | POA: Diagnosis not present

## 2017-01-03 DIAGNOSIS — J3089 Other allergic rhinitis: Secondary | ICD-10-CM | POA: Diagnosis not present

## 2017-01-03 DIAGNOSIS — J301 Allergic rhinitis due to pollen: Secondary | ICD-10-CM | POA: Diagnosis not present

## 2017-01-10 DIAGNOSIS — E049 Nontoxic goiter, unspecified: Secondary | ICD-10-CM | POA: Diagnosis not present

## 2017-01-10 DIAGNOSIS — J301 Allergic rhinitis due to pollen: Secondary | ICD-10-CM | POA: Diagnosis not present

## 2017-01-10 DIAGNOSIS — J3089 Other allergic rhinitis: Secondary | ICD-10-CM | POA: Diagnosis not present

## 2017-01-15 DIAGNOSIS — J301 Allergic rhinitis due to pollen: Secondary | ICD-10-CM | POA: Diagnosis not present

## 2017-01-15 DIAGNOSIS — J3089 Other allergic rhinitis: Secondary | ICD-10-CM | POA: Diagnosis not present

## 2017-01-17 DIAGNOSIS — J3089 Other allergic rhinitis: Secondary | ICD-10-CM | POA: Diagnosis not present

## 2017-01-24 DIAGNOSIS — J301 Allergic rhinitis due to pollen: Secondary | ICD-10-CM | POA: Diagnosis not present

## 2017-01-24 DIAGNOSIS — J3089 Other allergic rhinitis: Secondary | ICD-10-CM | POA: Diagnosis not present

## 2017-01-24 DIAGNOSIS — E049 Nontoxic goiter, unspecified: Secondary | ICD-10-CM | POA: Diagnosis not present

## 2017-01-31 DIAGNOSIS — J301 Allergic rhinitis due to pollen: Secondary | ICD-10-CM | POA: Diagnosis not present

## 2017-01-31 DIAGNOSIS — J3089 Other allergic rhinitis: Secondary | ICD-10-CM | POA: Diagnosis not present

## 2017-02-07 DIAGNOSIS — J3089 Other allergic rhinitis: Secondary | ICD-10-CM | POA: Diagnosis not present

## 2017-02-07 DIAGNOSIS — J301 Allergic rhinitis due to pollen: Secondary | ICD-10-CM | POA: Diagnosis not present

## 2017-02-21 DIAGNOSIS — J3089 Other allergic rhinitis: Secondary | ICD-10-CM | POA: Diagnosis not present

## 2017-02-21 DIAGNOSIS — J301 Allergic rhinitis due to pollen: Secondary | ICD-10-CM | POA: Diagnosis not present

## 2017-02-27 DIAGNOSIS — J3089 Other allergic rhinitis: Secondary | ICD-10-CM | POA: Diagnosis not present

## 2017-02-27 DIAGNOSIS — J301 Allergic rhinitis due to pollen: Secondary | ICD-10-CM | POA: Diagnosis not present

## 2017-03-07 DIAGNOSIS — J301 Allergic rhinitis due to pollen: Secondary | ICD-10-CM | POA: Diagnosis not present

## 2017-03-07 DIAGNOSIS — J3089 Other allergic rhinitis: Secondary | ICD-10-CM | POA: Diagnosis not present

## 2017-03-07 DIAGNOSIS — H40022 Open angle with borderline findings, high risk, left eye: Secondary | ICD-10-CM | POA: Diagnosis not present

## 2017-03-07 DIAGNOSIS — H04123 Dry eye syndrome of bilateral lacrimal glands: Secondary | ICD-10-CM | POA: Diagnosis not present

## 2017-03-07 DIAGNOSIS — H401111 Primary open-angle glaucoma, right eye, mild stage: Secondary | ICD-10-CM | POA: Diagnosis not present

## 2017-03-14 DIAGNOSIS — J301 Allergic rhinitis due to pollen: Secondary | ICD-10-CM | POA: Diagnosis not present

## 2017-03-14 DIAGNOSIS — J3089 Other allergic rhinitis: Secondary | ICD-10-CM | POA: Diagnosis not present

## 2017-03-21 DIAGNOSIS — J3089 Other allergic rhinitis: Secondary | ICD-10-CM | POA: Diagnosis not present

## 2017-03-21 DIAGNOSIS — J301 Allergic rhinitis due to pollen: Secondary | ICD-10-CM | POA: Diagnosis not present

## 2017-03-28 DIAGNOSIS — Z0000 Encounter for general adult medical examination without abnormal findings: Secondary | ICD-10-CM | POA: Diagnosis not present

## 2017-03-28 DIAGNOSIS — E782 Mixed hyperlipidemia: Secondary | ICD-10-CM | POA: Diagnosis not present

## 2017-04-02 DIAGNOSIS — J301 Allergic rhinitis due to pollen: Secondary | ICD-10-CM | POA: Diagnosis not present

## 2017-04-02 DIAGNOSIS — J3089 Other allergic rhinitis: Secondary | ICD-10-CM | POA: Diagnosis not present

## 2017-04-04 DIAGNOSIS — Z1231 Encounter for screening mammogram for malignant neoplasm of breast: Secondary | ICD-10-CM | POA: Diagnosis not present

## 2017-04-09 DIAGNOSIS — J3089 Other allergic rhinitis: Secondary | ICD-10-CM | POA: Diagnosis not present

## 2017-04-09 DIAGNOSIS — J301 Allergic rhinitis due to pollen: Secondary | ICD-10-CM | POA: Diagnosis not present

## 2017-04-18 DIAGNOSIS — J301 Allergic rhinitis due to pollen: Secondary | ICD-10-CM | POA: Diagnosis not present

## 2017-04-18 DIAGNOSIS — J3089 Other allergic rhinitis: Secondary | ICD-10-CM | POA: Diagnosis not present

## 2017-04-22 DIAGNOSIS — J3089 Other allergic rhinitis: Secondary | ICD-10-CM | POA: Diagnosis not present

## 2017-04-22 DIAGNOSIS — J301 Allergic rhinitis due to pollen: Secondary | ICD-10-CM | POA: Diagnosis not present

## 2017-04-25 DIAGNOSIS — J3089 Other allergic rhinitis: Secondary | ICD-10-CM | POA: Diagnosis not present

## 2017-04-25 DIAGNOSIS — J301 Allergic rhinitis due to pollen: Secondary | ICD-10-CM | POA: Diagnosis not present

## 2017-05-02 DIAGNOSIS — J3089 Other allergic rhinitis: Secondary | ICD-10-CM | POA: Diagnosis not present

## 2017-05-02 DIAGNOSIS — J301 Allergic rhinitis due to pollen: Secondary | ICD-10-CM | POA: Diagnosis not present

## 2017-05-09 DIAGNOSIS — J3089 Other allergic rhinitis: Secondary | ICD-10-CM | POA: Diagnosis not present

## 2017-05-09 DIAGNOSIS — J301 Allergic rhinitis due to pollen: Secondary | ICD-10-CM | POA: Diagnosis not present

## 2017-05-16 DIAGNOSIS — J301 Allergic rhinitis due to pollen: Secondary | ICD-10-CM | POA: Diagnosis not present

## 2017-05-16 DIAGNOSIS — J3089 Other allergic rhinitis: Secondary | ICD-10-CM | POA: Diagnosis not present

## 2017-05-22 DIAGNOSIS — J301 Allergic rhinitis due to pollen: Secondary | ICD-10-CM | POA: Diagnosis not present

## 2017-05-22 DIAGNOSIS — J3089 Other allergic rhinitis: Secondary | ICD-10-CM | POA: Diagnosis not present

## 2017-05-30 DIAGNOSIS — J3089 Other allergic rhinitis: Secondary | ICD-10-CM | POA: Diagnosis not present

## 2017-05-30 DIAGNOSIS — J301 Allergic rhinitis due to pollen: Secondary | ICD-10-CM | POA: Diagnosis not present

## 2017-06-05 DIAGNOSIS — J301 Allergic rhinitis due to pollen: Secondary | ICD-10-CM | POA: Diagnosis not present

## 2017-06-05 DIAGNOSIS — J3089 Other allergic rhinitis: Secondary | ICD-10-CM | POA: Diagnosis not present

## 2017-06-09 DIAGNOSIS — J301 Allergic rhinitis due to pollen: Secondary | ICD-10-CM | POA: Diagnosis not present

## 2017-06-10 DIAGNOSIS — J3089 Other allergic rhinitis: Secondary | ICD-10-CM | POA: Diagnosis not present

## 2017-06-13 DIAGNOSIS — R51 Headache: Secondary | ICD-10-CM | POA: Diagnosis not present

## 2017-06-13 DIAGNOSIS — J301 Allergic rhinitis due to pollen: Secondary | ICD-10-CM | POA: Diagnosis not present

## 2017-06-13 DIAGNOSIS — J3089 Other allergic rhinitis: Secondary | ICD-10-CM | POA: Diagnosis not present

## 2017-06-13 DIAGNOSIS — J302 Other seasonal allergic rhinitis: Secondary | ICD-10-CM | POA: Diagnosis not present

## 2017-06-27 DIAGNOSIS — J3089 Other allergic rhinitis: Secondary | ICD-10-CM | POA: Diagnosis not present

## 2017-06-27 DIAGNOSIS — J301 Allergic rhinitis due to pollen: Secondary | ICD-10-CM | POA: Diagnosis not present

## 2017-07-02 DIAGNOSIS — J3089 Other allergic rhinitis: Secondary | ICD-10-CM | POA: Diagnosis not present

## 2017-07-02 DIAGNOSIS — J301 Allergic rhinitis due to pollen: Secondary | ICD-10-CM | POA: Diagnosis not present

## 2017-07-11 DIAGNOSIS — J3089 Other allergic rhinitis: Secondary | ICD-10-CM | POA: Diagnosis not present

## 2017-07-11 DIAGNOSIS — J301 Allergic rhinitis due to pollen: Secondary | ICD-10-CM | POA: Diagnosis not present

## 2017-07-17 DIAGNOSIS — J301 Allergic rhinitis due to pollen: Secondary | ICD-10-CM | POA: Diagnosis not present

## 2017-07-17 DIAGNOSIS — J3089 Other allergic rhinitis: Secondary | ICD-10-CM | POA: Diagnosis not present

## 2017-07-24 DIAGNOSIS — J301 Allergic rhinitis due to pollen: Secondary | ICD-10-CM | POA: Diagnosis not present

## 2017-07-24 DIAGNOSIS — J3089 Other allergic rhinitis: Secondary | ICD-10-CM | POA: Diagnosis not present

## 2017-07-29 DIAGNOSIS — J3089 Other allergic rhinitis: Secondary | ICD-10-CM | POA: Diagnosis not present

## 2017-07-29 DIAGNOSIS — J301 Allergic rhinitis due to pollen: Secondary | ICD-10-CM | POA: Diagnosis not present

## 2017-07-31 DIAGNOSIS — J3089 Other allergic rhinitis: Secondary | ICD-10-CM | POA: Diagnosis not present

## 2017-07-31 DIAGNOSIS — J301 Allergic rhinitis due to pollen: Secondary | ICD-10-CM | POA: Diagnosis not present

## 2017-08-08 DIAGNOSIS — J3089 Other allergic rhinitis: Secondary | ICD-10-CM | POA: Diagnosis not present

## 2017-08-08 DIAGNOSIS — J301 Allergic rhinitis due to pollen: Secondary | ICD-10-CM | POA: Diagnosis not present

## 2017-08-15 DIAGNOSIS — J3089 Other allergic rhinitis: Secondary | ICD-10-CM | POA: Diagnosis not present

## 2017-08-15 DIAGNOSIS — J301 Allergic rhinitis due to pollen: Secondary | ICD-10-CM | POA: Diagnosis not present

## 2017-08-15 DIAGNOSIS — Z23 Encounter for immunization: Secondary | ICD-10-CM | POA: Diagnosis not present

## 2017-08-22 DIAGNOSIS — J301 Allergic rhinitis due to pollen: Secondary | ICD-10-CM | POA: Diagnosis not present

## 2017-08-22 DIAGNOSIS — J3089 Other allergic rhinitis: Secondary | ICD-10-CM | POA: Diagnosis not present

## 2017-08-29 DIAGNOSIS — J301 Allergic rhinitis due to pollen: Secondary | ICD-10-CM | POA: Diagnosis not present

## 2017-08-29 DIAGNOSIS — J3089 Other allergic rhinitis: Secondary | ICD-10-CM | POA: Diagnosis not present

## 2017-09-05 DIAGNOSIS — J301 Allergic rhinitis due to pollen: Secondary | ICD-10-CM | POA: Diagnosis not present

## 2017-09-05 DIAGNOSIS — J3089 Other allergic rhinitis: Secondary | ICD-10-CM | POA: Diagnosis not present

## 2017-09-05 DIAGNOSIS — K219 Gastro-esophageal reflux disease without esophagitis: Secondary | ICD-10-CM | POA: Diagnosis not present

## 2017-09-12 DIAGNOSIS — H04123 Dry eye syndrome of bilateral lacrimal glands: Secondary | ICD-10-CM | POA: Diagnosis not present

## 2017-09-12 DIAGNOSIS — H401111 Primary open-angle glaucoma, right eye, mild stage: Secondary | ICD-10-CM | POA: Diagnosis not present

## 2017-09-12 DIAGNOSIS — J301 Allergic rhinitis due to pollen: Secondary | ICD-10-CM | POA: Diagnosis not present

## 2017-09-12 DIAGNOSIS — H40022 Open angle with borderline findings, high risk, left eye: Secondary | ICD-10-CM | POA: Diagnosis not present

## 2017-09-12 DIAGNOSIS — J3089 Other allergic rhinitis: Secondary | ICD-10-CM | POA: Diagnosis not present

## 2017-09-19 DIAGNOSIS — J301 Allergic rhinitis due to pollen: Secondary | ICD-10-CM | POA: Diagnosis not present

## 2017-09-19 DIAGNOSIS — J3089 Other allergic rhinitis: Secondary | ICD-10-CM | POA: Diagnosis not present

## 2017-09-26 DIAGNOSIS — J301 Allergic rhinitis due to pollen: Secondary | ICD-10-CM | POA: Diagnosis not present

## 2017-09-26 DIAGNOSIS — J3089 Other allergic rhinitis: Secondary | ICD-10-CM | POA: Diagnosis not present

## 2017-10-03 DIAGNOSIS — J301 Allergic rhinitis due to pollen: Secondary | ICD-10-CM | POA: Diagnosis not present

## 2017-10-03 DIAGNOSIS — J3089 Other allergic rhinitis: Secondary | ICD-10-CM | POA: Diagnosis not present

## 2017-10-17 DIAGNOSIS — J301 Allergic rhinitis due to pollen: Secondary | ICD-10-CM | POA: Diagnosis not present

## 2017-10-17 DIAGNOSIS — J3089 Other allergic rhinitis: Secondary | ICD-10-CM | POA: Diagnosis not present

## 2017-10-24 DIAGNOSIS — J3089 Other allergic rhinitis: Secondary | ICD-10-CM | POA: Diagnosis not present

## 2017-10-24 DIAGNOSIS — J301 Allergic rhinitis due to pollen: Secondary | ICD-10-CM | POA: Diagnosis not present

## 2017-10-31 DIAGNOSIS — J3089 Other allergic rhinitis: Secondary | ICD-10-CM | POA: Diagnosis not present

## 2017-10-31 DIAGNOSIS — J301 Allergic rhinitis due to pollen: Secondary | ICD-10-CM | POA: Diagnosis not present

## 2017-11-06 DIAGNOSIS — J301 Allergic rhinitis due to pollen: Secondary | ICD-10-CM | POA: Diagnosis not present

## 2017-11-06 DIAGNOSIS — J3089 Other allergic rhinitis: Secondary | ICD-10-CM | POA: Diagnosis not present

## 2017-11-17 DIAGNOSIS — J3089 Other allergic rhinitis: Secondary | ICD-10-CM | POA: Diagnosis not present

## 2017-11-17 DIAGNOSIS — J301 Allergic rhinitis due to pollen: Secondary | ICD-10-CM | POA: Diagnosis not present

## 2017-11-26 DIAGNOSIS — J3089 Other allergic rhinitis: Secondary | ICD-10-CM | POA: Diagnosis not present

## 2017-11-26 DIAGNOSIS — J301 Allergic rhinitis due to pollen: Secondary | ICD-10-CM | POA: Diagnosis not present

## 2017-12-04 DIAGNOSIS — J301 Allergic rhinitis due to pollen: Secondary | ICD-10-CM | POA: Diagnosis not present

## 2017-12-05 DIAGNOSIS — J301 Allergic rhinitis due to pollen: Secondary | ICD-10-CM | POA: Diagnosis not present

## 2017-12-05 DIAGNOSIS — J3089 Other allergic rhinitis: Secondary | ICD-10-CM | POA: Diagnosis not present

## 2017-12-11 DIAGNOSIS — J3089 Other allergic rhinitis: Secondary | ICD-10-CM | POA: Diagnosis not present

## 2017-12-11 DIAGNOSIS — J301 Allergic rhinitis due to pollen: Secondary | ICD-10-CM | POA: Diagnosis not present

## 2017-12-19 DIAGNOSIS — J3089 Other allergic rhinitis: Secondary | ICD-10-CM | POA: Diagnosis not present

## 2017-12-19 DIAGNOSIS — J301 Allergic rhinitis due to pollen: Secondary | ICD-10-CM | POA: Diagnosis not present

## 2017-12-25 DIAGNOSIS — J301 Allergic rhinitis due to pollen: Secondary | ICD-10-CM | POA: Diagnosis not present

## 2017-12-25 DIAGNOSIS — J3089 Other allergic rhinitis: Secondary | ICD-10-CM | POA: Diagnosis not present

## 2018-01-02 DIAGNOSIS — J301 Allergic rhinitis due to pollen: Secondary | ICD-10-CM | POA: Diagnosis not present

## 2018-01-02 DIAGNOSIS — J3089 Other allergic rhinitis: Secondary | ICD-10-CM | POA: Diagnosis not present

## 2018-01-07 ENCOUNTER — Other Ambulatory Visit: Payer: Self-pay | Admitting: Endocrinology

## 2018-01-08 ENCOUNTER — Other Ambulatory Visit: Payer: Self-pay | Admitting: Endocrinology

## 2018-01-08 DIAGNOSIS — E049 Nontoxic goiter, unspecified: Secondary | ICD-10-CM

## 2018-01-09 DIAGNOSIS — J3089 Other allergic rhinitis: Secondary | ICD-10-CM | POA: Diagnosis not present

## 2018-01-09 DIAGNOSIS — J301 Allergic rhinitis due to pollen: Secondary | ICD-10-CM | POA: Diagnosis not present

## 2018-01-15 DIAGNOSIS — J301 Allergic rhinitis due to pollen: Secondary | ICD-10-CM | POA: Diagnosis not present

## 2018-01-16 ENCOUNTER — Other Ambulatory Visit: Payer: 59

## 2018-01-22 DIAGNOSIS — J301 Allergic rhinitis due to pollen: Secondary | ICD-10-CM | POA: Diagnosis not present

## 2018-01-22 DIAGNOSIS — J3089 Other allergic rhinitis: Secondary | ICD-10-CM | POA: Diagnosis not present

## 2018-01-23 ENCOUNTER — Ambulatory Visit
Admission: RE | Admit: 2018-01-23 | Discharge: 2018-01-23 | Disposition: A | Discharge status: Home / Self Care | Payer: 59 | Source: Ambulatory Visit | Attending: Endocrinology | Admitting: Endocrinology

## 2018-01-23 DIAGNOSIS — E041 Nontoxic single thyroid nodule: Secondary | ICD-10-CM | POA: Diagnosis not present

## 2018-01-23 DIAGNOSIS — E049 Nontoxic goiter, unspecified: Secondary | ICD-10-CM

## 2018-01-30 DIAGNOSIS — J301 Allergic rhinitis due to pollen: Secondary | ICD-10-CM | POA: Diagnosis not present

## 2018-01-30 DIAGNOSIS — J3089 Other allergic rhinitis: Secondary | ICD-10-CM | POA: Diagnosis not present

## 2018-01-30 DIAGNOSIS — E049 Nontoxic goiter, unspecified: Secondary | ICD-10-CM | POA: Diagnosis not present

## 2018-02-06 DIAGNOSIS — R2681 Unsteadiness on feet: Secondary | ICD-10-CM | POA: Diagnosis not present

## 2018-02-06 DIAGNOSIS — J3089 Other allergic rhinitis: Secondary | ICD-10-CM | POA: Diagnosis not present

## 2018-02-06 DIAGNOSIS — R0989 Other specified symptoms and signs involving the circulatory and respiratory systems: Secondary | ICD-10-CM | POA: Diagnosis not present

## 2018-02-06 DIAGNOSIS — E782 Mixed hyperlipidemia: Secondary | ICD-10-CM | POA: Diagnosis not present

## 2018-02-06 DIAGNOSIS — J301 Allergic rhinitis due to pollen: Secondary | ICD-10-CM | POA: Diagnosis not present

## 2018-02-06 DIAGNOSIS — M79671 Pain in right foot: Secondary | ICD-10-CM | POA: Diagnosis not present

## 2018-02-09 ENCOUNTER — Other Ambulatory Visit: Payer: Self-pay | Admitting: Family Medicine

## 2018-02-09 DIAGNOSIS — R0989 Other specified symptoms and signs involving the circulatory and respiratory systems: Secondary | ICD-10-CM

## 2018-02-13 DIAGNOSIS — J301 Allergic rhinitis due to pollen: Secondary | ICD-10-CM | POA: Diagnosis not present

## 2018-02-13 DIAGNOSIS — M25571 Pain in right ankle and joints of right foot: Secondary | ICD-10-CM | POA: Diagnosis not present

## 2018-02-13 DIAGNOSIS — M25572 Pain in left ankle and joints of left foot: Secondary | ICD-10-CM | POA: Diagnosis not present

## 2018-02-13 DIAGNOSIS — J3089 Other allergic rhinitis: Secondary | ICD-10-CM | POA: Diagnosis not present

## 2018-02-20 DIAGNOSIS — J3089 Other allergic rhinitis: Secondary | ICD-10-CM | POA: Diagnosis not present

## 2018-02-20 DIAGNOSIS — J301 Allergic rhinitis due to pollen: Secondary | ICD-10-CM | POA: Diagnosis not present

## 2018-02-21 DIAGNOSIS — B370 Candidal stomatitis: Secondary | ICD-10-CM | POA: Diagnosis not present

## 2018-02-21 DIAGNOSIS — J029 Acute pharyngitis, unspecified: Secondary | ICD-10-CM | POA: Diagnosis not present

## 2018-03-13 DIAGNOSIS — H04123 Dry eye syndrome of bilateral lacrimal glands: Secondary | ICD-10-CM | POA: Diagnosis not present

## 2018-03-13 DIAGNOSIS — J301 Allergic rhinitis due to pollen: Secondary | ICD-10-CM | POA: Diagnosis not present

## 2018-03-13 DIAGNOSIS — H401111 Primary open-angle glaucoma, right eye, mild stage: Secondary | ICD-10-CM | POA: Diagnosis not present

## 2018-03-13 DIAGNOSIS — J3089 Other allergic rhinitis: Secondary | ICD-10-CM | POA: Diagnosis not present

## 2018-03-13 DIAGNOSIS — H40022 Open angle with borderline findings, high risk, left eye: Secondary | ICD-10-CM | POA: Diagnosis not present

## 2018-03-20 DIAGNOSIS — J301 Allergic rhinitis due to pollen: Secondary | ICD-10-CM | POA: Diagnosis not present

## 2018-03-20 DIAGNOSIS — J3089 Other allergic rhinitis: Secondary | ICD-10-CM | POA: Diagnosis not present

## 2018-03-26 DIAGNOSIS — J3089 Other allergic rhinitis: Secondary | ICD-10-CM | POA: Diagnosis not present

## 2018-03-26 DIAGNOSIS — J301 Allergic rhinitis due to pollen: Secondary | ICD-10-CM | POA: Diagnosis not present

## 2018-03-27 ENCOUNTER — Ambulatory Visit
Admission: RE | Admit: 2018-03-27 | Discharge: 2018-03-27 | Disposition: A | Discharge status: Home / Self Care | Payer: 59 | Source: Ambulatory Visit | Attending: Family Medicine | Admitting: Family Medicine

## 2018-03-27 DIAGNOSIS — R0989 Other specified symptoms and signs involving the circulatory and respiratory systems: Secondary | ICD-10-CM | POA: Diagnosis not present

## 2018-04-03 DIAGNOSIS — J301 Allergic rhinitis due to pollen: Secondary | ICD-10-CM | POA: Diagnosis not present

## 2018-04-03 DIAGNOSIS — J3089 Other allergic rhinitis: Secondary | ICD-10-CM | POA: Diagnosis not present

## 2018-04-10 DIAGNOSIS — J301 Allergic rhinitis due to pollen: Secondary | ICD-10-CM | POA: Diagnosis not present

## 2018-04-10 DIAGNOSIS — Z23 Encounter for immunization: Secondary | ICD-10-CM | POA: Diagnosis not present

## 2018-04-10 DIAGNOSIS — J3089 Other allergic rhinitis: Secondary | ICD-10-CM | POA: Diagnosis not present

## 2018-04-10 DIAGNOSIS — Z0000 Encounter for general adult medical examination without abnormal findings: Secondary | ICD-10-CM | POA: Diagnosis not present

## 2018-04-17 DIAGNOSIS — J301 Allergic rhinitis due to pollen: Secondary | ICD-10-CM | POA: Diagnosis not present

## 2018-04-17 DIAGNOSIS — J3089 Other allergic rhinitis: Secondary | ICD-10-CM | POA: Diagnosis not present

## 2018-04-22 DIAGNOSIS — J301 Allergic rhinitis due to pollen: Secondary | ICD-10-CM | POA: Diagnosis not present

## 2018-04-22 DIAGNOSIS — J3089 Other allergic rhinitis: Secondary | ICD-10-CM | POA: Diagnosis not present

## 2018-05-01 DIAGNOSIS — J3089 Other allergic rhinitis: Secondary | ICD-10-CM | POA: Diagnosis not present

## 2018-05-01 DIAGNOSIS — J301 Allergic rhinitis due to pollen: Secondary | ICD-10-CM | POA: Diagnosis not present

## 2018-05-08 DIAGNOSIS — J301 Allergic rhinitis due to pollen: Secondary | ICD-10-CM | POA: Diagnosis not present

## 2018-05-08 DIAGNOSIS — J3089 Other allergic rhinitis: Secondary | ICD-10-CM | POA: Diagnosis not present

## 2018-05-14 DIAGNOSIS — J3089 Other allergic rhinitis: Secondary | ICD-10-CM | POA: Diagnosis not present

## 2018-05-14 DIAGNOSIS — J301 Allergic rhinitis due to pollen: Secondary | ICD-10-CM | POA: Diagnosis not present

## 2018-05-15 DIAGNOSIS — Z1159 Encounter for screening for other viral diseases: Secondary | ICD-10-CM | POA: Diagnosis not present

## 2018-05-15 DIAGNOSIS — J301 Allergic rhinitis due to pollen: Secondary | ICD-10-CM | POA: Diagnosis not present

## 2018-05-15 DIAGNOSIS — Z0000 Encounter for general adult medical examination without abnormal findings: Secondary | ICD-10-CM | POA: Diagnosis not present

## 2018-05-15 DIAGNOSIS — J3089 Other allergic rhinitis: Secondary | ICD-10-CM | POA: Diagnosis not present

## 2018-05-28 DIAGNOSIS — J3089 Other allergic rhinitis: Secondary | ICD-10-CM | POA: Diagnosis not present

## 2018-05-28 DIAGNOSIS — J301 Allergic rhinitis due to pollen: Secondary | ICD-10-CM | POA: Diagnosis not present

## 2018-06-04 DIAGNOSIS — J3089 Other allergic rhinitis: Secondary | ICD-10-CM | POA: Diagnosis not present

## 2018-06-04 DIAGNOSIS — J301 Allergic rhinitis due to pollen: Secondary | ICD-10-CM | POA: Diagnosis not present

## 2018-06-12 DIAGNOSIS — J301 Allergic rhinitis due to pollen: Secondary | ICD-10-CM | POA: Diagnosis not present

## 2018-06-12 DIAGNOSIS — J3089 Other allergic rhinitis: Secondary | ICD-10-CM | POA: Diagnosis not present

## 2018-06-16 DIAGNOSIS — M8588 Other specified disorders of bone density and structure, other site: Secondary | ICD-10-CM | POA: Diagnosis not present

## 2018-06-18 DIAGNOSIS — J301 Allergic rhinitis due to pollen: Secondary | ICD-10-CM | POA: Diagnosis not present

## 2018-06-18 DIAGNOSIS — J3089 Other allergic rhinitis: Secondary | ICD-10-CM | POA: Diagnosis not present

## 2018-06-26 DIAGNOSIS — M79671 Pain in right foot: Secondary | ICD-10-CM | POA: Diagnosis not present

## 2018-06-26 DIAGNOSIS — J3089 Other allergic rhinitis: Secondary | ICD-10-CM | POA: Diagnosis not present

## 2018-06-26 DIAGNOSIS — M79672 Pain in left foot: Secondary | ICD-10-CM | POA: Diagnosis not present

## 2018-06-26 DIAGNOSIS — J301 Allergic rhinitis due to pollen: Secondary | ICD-10-CM | POA: Diagnosis not present

## 2018-06-29 ENCOUNTER — Encounter: Payer: Self-pay | Admitting: Neurology

## 2018-06-29 ENCOUNTER — Other Ambulatory Visit: Payer: Self-pay | Admitting: *Deleted

## 2018-06-29 DIAGNOSIS — G5753 Tarsal tunnel syndrome, bilateral lower limbs: Secondary | ICD-10-CM

## 2018-07-02 DIAGNOSIS — J301 Allergic rhinitis due to pollen: Secondary | ICD-10-CM | POA: Diagnosis not present

## 2018-07-02 DIAGNOSIS — J3089 Other allergic rhinitis: Secondary | ICD-10-CM | POA: Diagnosis not present

## 2018-07-10 DIAGNOSIS — J3089 Other allergic rhinitis: Secondary | ICD-10-CM | POA: Diagnosis not present

## 2018-07-10 DIAGNOSIS — J301 Allergic rhinitis due to pollen: Secondary | ICD-10-CM | POA: Diagnosis not present

## 2018-07-16 ENCOUNTER — Ambulatory Visit (INDEPENDENT_AMBULATORY_CARE_PROVIDER_SITE_OTHER): Payer: 59 | Admitting: Neurology

## 2018-07-16 DIAGNOSIS — G5753 Tarsal tunnel syndrome, bilateral lower limbs: Secondary | ICD-10-CM | POA: Diagnosis not present

## 2018-07-16 DIAGNOSIS — M79672 Pain in left foot: Principal | ICD-10-CM

## 2018-07-16 DIAGNOSIS — M79671 Pain in right foot: Secondary | ICD-10-CM

## 2018-07-16 NOTE — Procedures (Signed)
[facility]  [address], [address]  [place], [place] [address] Tel: [phone] Fax:  [phone] Test Date:  07/16/2018  Patient: Katrina Robertson DOB: 07/29/1956 Physician: Nita Sickleonika Ameira Alessandrini, DO  Sex: Female Height: 5\' 1"  Ref Phys: Pati GalloJames Kramer, MD  ID#: [id number] Temp: 33.2C Technician:    Patient Complaints: This is a [age] year old female bilateral feet pain referred to evaluate for tarsal tunnel syndrome.  NCV & EMG Findings: Extensive electrodiagnostic testing of the right lower extremity and additional studies of the left shows:  1. Bilateral sural, superficial peroneal, and mixed and lateral plantar sensory responses are within normal limits. 2. Bilateral tibial and peroneal motor responses are within normal limits. 3. Bilateral tibial H reflex studies are within normal limits. 4. There is no evidence of active or chronic motor axon loss changes affecting any of the tested muscles. Motor unit configuration and recruitment pattern is within normal limits.   Impression: This is a normal study of the lower extremities. Gisella particular, there is no evidence of tarsal tunnel syndrome, a sensorimotor neuropathy, or lumbosacral radiculopathy.     ___________________________ Nita Sickleonika Yalissa Fink, DO    Nerve Conduction Studies Anti Sensory Summary Table   Site NR Peak (ms) Norm Peak (ms) P-T Amp (V) Norm P-T Amp  Left Sup Peroneal Anti Sensory (Ant Lat Mall)  12 cm    2.3 <4.6 13.6 >3  Right Sup Peroneal Anti Sensory (Ant Lat Mall)  12 cm    2.5 <4.6 18.0 >3  Left Sural Anti Sensory (Lat Mall)  Calf    2.9 <4.6 18.6 >3  Right Sural Anti Sensory (Lat Mall)  Calf    3.2 <4.6 18.4 >3   Motor Summary Table   Site NR Onset (ms) Norm Onset (ms) O-P Amp (mV) Norm O-P Amp Site1 Site2 Delta-0 (ms) Dist (cm) Vel (m/s) Norm Vel (m/s)  Left Peroneal Motor (Ext Dig Brev)  Ankle    3.2 <6.0 3.6 >2.5 B Fib Ankle 6.1 30.0 49 >40  B Fib    9.3  3.5  Poplt B Fib 1.4 7.0 50 >40  Poplt     10.7  3.4         Right Peroneal Motor (Ext Dig Brev)  Ankle    3.0 <6.0 3.1 >2.5 B Fib Ankle 6.1 33.0 54 >40  B Fib    9.1  3.3  Poplt B Fib 1.1 7.0 64 >40  Poplt    10.2  3.2         Left Tibial Motor (Abd Hall Brev)  Ankle    4.9 <6.0 14.6 >4 Knee Ankle 7.1 31.0 44 >40  Knee    12.0  11.6         Right Tibial Motor (Abd Hall Brev)  Ankle    4.2 <6.0 16.7 >4 Knee Ankle 7.0 33.0 47 >40  Knee    11.2  15.6         Left Tibial (ADQP) Motor (ADQP)  Ankle    4.8 <6.5 7.4 >3        Right Tibial (ADQP) Motor (ADQP)  Ankle    5.4 <6.5 7.9 >3         Mixed Summary Table   Site NR Peak (ms) Norm Peak (ms) P-T Amp (V) Norm P-T Amp  Right Lateral Plantar Mixed (Med Malleolus)  Lateral Foot    2.8 <3.7 9.6 >8  Right Medial Plantar Mixed (Med Malleolus)  Medial Foot    2.7 <3.7 16.7 >  8  Left Lateral Plantar Mixed Sensory (Med Malleolus)  Digit 5    3.0  11.0   Left Medial Plantar Mixed Sensory (Med Malleolus)  Digit 1    2.6  17.0    H Reflex Studies   NR H-Lat (ms) Lat Norm (ms) L-R H-Lat (ms)  Left Tibial (Gastroc)     31.02 <35 1.90  Right Tibial (Gastroc)     29.12 <35 1.90   EMG   Side Muscle Ins Act Fibs Psw Fasc Number Recrt Dur Dur. Amp Amp. Poly Poly. Comment  Right AntTibialis Nml Nml Nml Nml Nml Nml Nml Nml Nml Nml Nml Nml N/A  Right Gastroc Nml Nml Nml Nml Nml Nml Nml Nml Nml Nml Nml Nml N/A  Right Flex Dig Long Nml Nml Nml Nml Nml Nml Nml Nml Nml Nml Nml Nml N/A  Right AbdHallucis Nml Nml Nml Nml Nml Nml Nml Nml Nml Nml Nml Nml N/A  Right RectFemoris Nml Nml Nml Nml Nml Nml Nml Nml Nml Nml Nml Nml N/A  Left AntTibialis Nml Nml Nml Nml Nml Nml Nml Nml Nml Nml Nml Nml N/A  Left Gastroc Nml Nml Nml Nml Nml Nml Nml Nml Nml Nml Nml Nml N/A  Left Flex Dig Long Nml Nml Nml Nml Nml Nml Nml Nml Nml Nml Nml Nml N/A  Left AbdHallucis Nml Nml Nml Nml Nml Nml Nml Nml Nml Nml Nml Nml N/A  Left RectFemoris Nml Nml Nml Nml Nml Nml Nml Nml Nml Nml Nml Nml N/A      Waveforms:

## 2018-07-17 DIAGNOSIS — J301 Allergic rhinitis due to pollen: Secondary | ICD-10-CM | POA: Diagnosis not present

## 2018-07-17 DIAGNOSIS — J3089 Other allergic rhinitis: Secondary | ICD-10-CM | POA: Diagnosis not present

## 2018-07-24 DIAGNOSIS — J301 Allergic rhinitis due to pollen: Secondary | ICD-10-CM | POA: Diagnosis not present

## 2018-07-24 DIAGNOSIS — J3089 Other allergic rhinitis: Secondary | ICD-10-CM | POA: Diagnosis not present

## 2018-07-30 DIAGNOSIS — J3089 Other allergic rhinitis: Secondary | ICD-10-CM | POA: Diagnosis not present

## 2018-07-30 DIAGNOSIS — J301 Allergic rhinitis due to pollen: Secondary | ICD-10-CM | POA: Diagnosis not present

## 2018-08-07 DIAGNOSIS — J301 Allergic rhinitis due to pollen: Secondary | ICD-10-CM | POA: Diagnosis not present

## 2018-08-07 DIAGNOSIS — J3089 Other allergic rhinitis: Secondary | ICD-10-CM | POA: Diagnosis not present

## 2018-08-14 DIAGNOSIS — J3089 Other allergic rhinitis: Secondary | ICD-10-CM | POA: Diagnosis not present

## 2018-08-14 DIAGNOSIS — J301 Allergic rhinitis due to pollen: Secondary | ICD-10-CM | POA: Diagnosis not present

## 2018-08-14 DIAGNOSIS — Z23 Encounter for immunization: Secondary | ICD-10-CM | POA: Diagnosis not present

## 2018-08-21 DIAGNOSIS — J301 Allergic rhinitis due to pollen: Secondary | ICD-10-CM | POA: Diagnosis not present

## 2018-08-21 DIAGNOSIS — J3089 Other allergic rhinitis: Secondary | ICD-10-CM | POA: Diagnosis not present

## 2018-08-28 DIAGNOSIS — J301 Allergic rhinitis due to pollen: Secondary | ICD-10-CM | POA: Diagnosis not present

## 2018-08-28 DIAGNOSIS — J3089 Other allergic rhinitis: Secondary | ICD-10-CM | POA: Diagnosis not present

## 2018-09-04 DIAGNOSIS — H1045 Other chronic allergic conjunctivitis: Secondary | ICD-10-CM | POA: Diagnosis not present

## 2018-09-04 DIAGNOSIS — J301 Allergic rhinitis due to pollen: Secondary | ICD-10-CM | POA: Diagnosis not present

## 2018-09-04 DIAGNOSIS — J3089 Other allergic rhinitis: Secondary | ICD-10-CM | POA: Diagnosis not present

## 2018-09-07 DIAGNOSIS — J301 Allergic rhinitis due to pollen: Secondary | ICD-10-CM | POA: Diagnosis not present

## 2018-09-08 DIAGNOSIS — J3089 Other allergic rhinitis: Secondary | ICD-10-CM | POA: Diagnosis not present

## 2018-09-11 DIAGNOSIS — Z1231 Encounter for screening mammogram for malignant neoplasm of breast: Secondary | ICD-10-CM | POA: Diagnosis not present

## 2018-09-11 DIAGNOSIS — Z681 Body mass index (BMI) 19 or less, adult: Secondary | ICD-10-CM | POA: Diagnosis not present

## 2018-09-11 DIAGNOSIS — Z01419 Encounter for gynecological examination (general) (routine) without abnormal findings: Secondary | ICD-10-CM | POA: Diagnosis not present

## 2018-09-11 DIAGNOSIS — Z1389 Encounter for screening for other disorder: Secondary | ICD-10-CM | POA: Diagnosis not present

## 2018-09-18 DIAGNOSIS — H353131 Nonexudative age-related macular degeneration, bilateral, early dry stage: Secondary | ICD-10-CM | POA: Diagnosis not present

## 2018-09-18 DIAGNOSIS — J301 Allergic rhinitis due to pollen: Secondary | ICD-10-CM | POA: Diagnosis not present

## 2018-09-18 DIAGNOSIS — J3089 Other allergic rhinitis: Secondary | ICD-10-CM | POA: Diagnosis not present

## 2018-09-18 DIAGNOSIS — H401131 Primary open-angle glaucoma, bilateral, mild stage: Secondary | ICD-10-CM | POA: Diagnosis not present

## 2018-09-18 DIAGNOSIS — H35033 Hypertensive retinopathy, bilateral: Secondary | ICD-10-CM | POA: Diagnosis not present

## 2018-09-22 DIAGNOSIS — J3089 Other allergic rhinitis: Secondary | ICD-10-CM | POA: Diagnosis not present

## 2018-09-22 DIAGNOSIS — J301 Allergic rhinitis due to pollen: Secondary | ICD-10-CM | POA: Diagnosis not present

## 2018-09-25 DIAGNOSIS — J301 Allergic rhinitis due to pollen: Secondary | ICD-10-CM | POA: Diagnosis not present

## 2018-09-25 DIAGNOSIS — J3089 Other allergic rhinitis: Secondary | ICD-10-CM | POA: Diagnosis not present

## 2018-10-02 DIAGNOSIS — J301 Allergic rhinitis due to pollen: Secondary | ICD-10-CM | POA: Diagnosis not present

## 2018-10-02 DIAGNOSIS — J3089 Other allergic rhinitis: Secondary | ICD-10-CM | POA: Diagnosis not present

## 2018-10-08 DIAGNOSIS — J301 Allergic rhinitis due to pollen: Secondary | ICD-10-CM | POA: Diagnosis not present

## 2018-10-08 DIAGNOSIS — H401111 Primary open-angle glaucoma, right eye, mild stage: Secondary | ICD-10-CM | POA: Diagnosis not present

## 2018-10-08 DIAGNOSIS — J3089 Other allergic rhinitis: Secondary | ICD-10-CM | POA: Diagnosis not present

## 2018-10-22 DIAGNOSIS — H401121 Primary open-angle glaucoma, left eye, mild stage: Secondary | ICD-10-CM | POA: Diagnosis not present

## 2018-10-23 DIAGNOSIS — J3089 Other allergic rhinitis: Secondary | ICD-10-CM | POA: Diagnosis not present

## 2018-10-23 DIAGNOSIS — J301 Allergic rhinitis due to pollen: Secondary | ICD-10-CM | POA: Diagnosis not present

## 2018-11-06 DIAGNOSIS — J3089 Other allergic rhinitis: Secondary | ICD-10-CM | POA: Diagnosis not present

## 2018-11-06 DIAGNOSIS — J301 Allergic rhinitis due to pollen: Secondary | ICD-10-CM | POA: Diagnosis not present

## 2018-11-08 DIAGNOSIS — J3489 Other specified disorders of nose and nasal sinuses: Secondary | ICD-10-CM | POA: Diagnosis not present

## 2018-11-19 DIAGNOSIS — J301 Allergic rhinitis due to pollen: Secondary | ICD-10-CM | POA: Diagnosis not present

## 2018-11-19 DIAGNOSIS — J3089 Other allergic rhinitis: Secondary | ICD-10-CM | POA: Diagnosis not present

## 2018-12-04 DIAGNOSIS — J301 Allergic rhinitis due to pollen: Secondary | ICD-10-CM | POA: Diagnosis not present

## 2018-12-04 DIAGNOSIS — J3089 Other allergic rhinitis: Secondary | ICD-10-CM | POA: Diagnosis not present

## 2018-12-18 DIAGNOSIS — J301 Allergic rhinitis due to pollen: Secondary | ICD-10-CM | POA: Diagnosis not present

## 2018-12-18 DIAGNOSIS — J3089 Other allergic rhinitis: Secondary | ICD-10-CM | POA: Diagnosis not present

## 2018-12-25 DIAGNOSIS — H401131 Primary open-angle glaucoma, bilateral, mild stage: Secondary | ICD-10-CM | POA: Diagnosis not present

## 2018-12-25 DIAGNOSIS — H04123 Dry eye syndrome of bilateral lacrimal glands: Secondary | ICD-10-CM | POA: Diagnosis not present

## 2018-12-25 DIAGNOSIS — M3501 Sicca syndrome with keratoconjunctivitis: Secondary | ICD-10-CM | POA: Diagnosis not present

## 2019-01-01 DIAGNOSIS — J301 Allergic rhinitis due to pollen: Secondary | ICD-10-CM | POA: Diagnosis not present

## 2019-01-01 DIAGNOSIS — J3089 Other allergic rhinitis: Secondary | ICD-10-CM | POA: Diagnosis not present

## 2019-01-13 DIAGNOSIS — R0982 Postnasal drip: Secondary | ICD-10-CM | POA: Diagnosis not present

## 2019-01-13 DIAGNOSIS — J310 Chronic rhinitis: Secondary | ICD-10-CM | POA: Diagnosis not present

## 2019-01-13 DIAGNOSIS — J343 Hypertrophy of nasal turbinates: Secondary | ICD-10-CM | POA: Diagnosis not present

## 2019-01-15 DIAGNOSIS — J301 Allergic rhinitis due to pollen: Secondary | ICD-10-CM | POA: Diagnosis not present

## 2019-01-15 DIAGNOSIS — J3089 Other allergic rhinitis: Secondary | ICD-10-CM | POA: Diagnosis not present

## 2019-01-20 DIAGNOSIS — R05 Cough: Secondary | ICD-10-CM | POA: Diagnosis not present

## 2019-01-20 DIAGNOSIS — J101 Influenza due to other identified influenza virus with other respiratory manifestations: Secondary | ICD-10-CM | POA: Diagnosis not present

## 2019-01-29 DIAGNOSIS — J3089 Other allergic rhinitis: Secondary | ICD-10-CM | POA: Diagnosis not present

## 2019-01-29 DIAGNOSIS — J301 Allergic rhinitis due to pollen: Secondary | ICD-10-CM | POA: Diagnosis not present

## 2019-02-11 DIAGNOSIS — J3089 Other allergic rhinitis: Secondary | ICD-10-CM | POA: Diagnosis not present

## 2019-02-11 DIAGNOSIS — J301 Allergic rhinitis due to pollen: Secondary | ICD-10-CM | POA: Diagnosis not present

## 2019-02-25 DIAGNOSIS — J301 Allergic rhinitis due to pollen: Secondary | ICD-10-CM | POA: Diagnosis not present

## 2019-02-25 DIAGNOSIS — J3089 Other allergic rhinitis: Secondary | ICD-10-CM | POA: Diagnosis not present

## 2019-03-10 DIAGNOSIS — J301 Allergic rhinitis due to pollen: Secondary | ICD-10-CM | POA: Diagnosis not present

## 2019-03-10 DIAGNOSIS — J3089 Other allergic rhinitis: Secondary | ICD-10-CM | POA: Diagnosis not present

## 2019-03-23 DIAGNOSIS — J3089 Other allergic rhinitis: Secondary | ICD-10-CM | POA: Diagnosis not present

## 2019-03-23 DIAGNOSIS — J301 Allergic rhinitis due to pollen: Secondary | ICD-10-CM | POA: Diagnosis not present

## 2019-03-26 DIAGNOSIS — H1045 Other chronic allergic conjunctivitis: Secondary | ICD-10-CM | POA: Diagnosis not present

## 2019-03-26 DIAGNOSIS — H04123 Dry eye syndrome of bilateral lacrimal glands: Secondary | ICD-10-CM | POA: Diagnosis not present

## 2019-03-26 DIAGNOSIS — H401131 Primary open-angle glaucoma, bilateral, mild stage: Secondary | ICD-10-CM | POA: Diagnosis not present

## 2019-04-27 ENCOUNTER — Other Ambulatory Visit: Payer: Self-pay | Admitting: Family Medicine

## 2019-04-27 DIAGNOSIS — R6889 Other general symptoms and signs: Secondary | ICD-10-CM

## 2019-05-07 ENCOUNTER — Ambulatory Visit
Admission: RE | Admit: 2019-05-07 | Discharge: 2019-05-07 | Disposition: A | Discharge status: Home / Self Care | Payer: 59 | Source: Ambulatory Visit | Attending: Family Medicine | Admitting: Family Medicine

## 2019-05-07 DIAGNOSIS — R6889 Other general symptoms and signs: Secondary | ICD-10-CM

## 2019-06-30 DIAGNOSIS — J301 Allergic rhinitis due to pollen: Secondary | ICD-10-CM | POA: Diagnosis not present

## 2019-06-30 DIAGNOSIS — J3089 Other allergic rhinitis: Secondary | ICD-10-CM | POA: Diagnosis not present

## 2019-07-14 DIAGNOSIS — J301 Allergic rhinitis due to pollen: Secondary | ICD-10-CM | POA: Diagnosis not present

## 2019-07-14 DIAGNOSIS — J3089 Other allergic rhinitis: Secondary | ICD-10-CM | POA: Diagnosis not present

## 2019-07-23 DIAGNOSIS — H401131 Primary open-angle glaucoma, bilateral, mild stage: Secondary | ICD-10-CM | POA: Diagnosis not present

## 2019-07-23 DIAGNOSIS — H04123 Dry eye syndrome of bilateral lacrimal glands: Secondary | ICD-10-CM | POA: Diagnosis not present

## 2019-07-27 DIAGNOSIS — Z23 Encounter for immunization: Secondary | ICD-10-CM | POA: Diagnosis not present

## 2019-07-27 DIAGNOSIS — Z823 Family history of stroke: Secondary | ICD-10-CM | POA: Diagnosis not present

## 2019-07-27 DIAGNOSIS — J301 Allergic rhinitis due to pollen: Secondary | ICD-10-CM | POA: Diagnosis not present

## 2019-07-27 DIAGNOSIS — E7800 Pure hypercholesterolemia, unspecified: Secondary | ICD-10-CM | POA: Diagnosis not present

## 2019-07-27 DIAGNOSIS — J3089 Other allergic rhinitis: Secondary | ICD-10-CM | POA: Diagnosis not present

## 2019-08-04 DIAGNOSIS — Z1389 Encounter for screening for other disorder: Secondary | ICD-10-CM | POA: Diagnosis not present

## 2019-08-04 DIAGNOSIS — Z681 Body mass index (BMI) 19 or less, adult: Secondary | ICD-10-CM | POA: Diagnosis not present

## 2019-08-04 DIAGNOSIS — Z1231 Encounter for screening mammogram for malignant neoplasm of breast: Secondary | ICD-10-CM | POA: Diagnosis not present

## 2019-08-04 DIAGNOSIS — Z01419 Encounter for gynecological examination (general) (routine) without abnormal findings: Secondary | ICD-10-CM | POA: Diagnosis not present

## 2019-08-04 DIAGNOSIS — E2839 Other primary ovarian failure: Secondary | ICD-10-CM | POA: Diagnosis not present

## 2019-08-11 ENCOUNTER — Other Ambulatory Visit: Payer: Self-pay | Admitting: Obstetrics and Gynecology

## 2019-08-11 DIAGNOSIS — J3089 Other allergic rhinitis: Secondary | ICD-10-CM | POA: Diagnosis not present

## 2019-08-11 DIAGNOSIS — J301 Allergic rhinitis due to pollen: Secondary | ICD-10-CM | POA: Diagnosis not present

## 2019-08-11 DIAGNOSIS — E2839 Other primary ovarian failure: Secondary | ICD-10-CM

## 2019-08-26 DIAGNOSIS — J3089 Other allergic rhinitis: Secondary | ICD-10-CM | POA: Diagnosis not present

## 2019-08-26 DIAGNOSIS — J301 Allergic rhinitis due to pollen: Secondary | ICD-10-CM | POA: Diagnosis not present

## 2019-09-01 DIAGNOSIS — J3089 Other allergic rhinitis: Secondary | ICD-10-CM | POA: Diagnosis not present

## 2019-09-01 DIAGNOSIS — J301 Allergic rhinitis due to pollen: Secondary | ICD-10-CM | POA: Diagnosis not present

## 2019-09-03 DIAGNOSIS — J3089 Other allergic rhinitis: Secondary | ICD-10-CM | POA: Diagnosis not present

## 2019-09-03 DIAGNOSIS — J301 Allergic rhinitis due to pollen: Secondary | ICD-10-CM | POA: Diagnosis not present

## 2019-09-03 DIAGNOSIS — K219 Gastro-esophageal reflux disease without esophagitis: Secondary | ICD-10-CM | POA: Diagnosis not present

## 2019-09-03 DIAGNOSIS — H1045 Other chronic allergic conjunctivitis: Secondary | ICD-10-CM | POA: Diagnosis not present

## 2019-09-08 DIAGNOSIS — J3089 Other allergic rhinitis: Secondary | ICD-10-CM | POA: Diagnosis not present

## 2019-09-08 DIAGNOSIS — J301 Allergic rhinitis due to pollen: Secondary | ICD-10-CM | POA: Diagnosis not present

## 2019-09-14 DIAGNOSIS — J301 Allergic rhinitis due to pollen: Secondary | ICD-10-CM | POA: Diagnosis not present

## 2019-09-14 DIAGNOSIS — J3089 Other allergic rhinitis: Secondary | ICD-10-CM | POA: Diagnosis not present

## 2019-10-11 DIAGNOSIS — J301 Allergic rhinitis due to pollen: Secondary | ICD-10-CM | POA: Diagnosis not present

## 2019-10-11 DIAGNOSIS — J3089 Other allergic rhinitis: Secondary | ICD-10-CM | POA: Diagnosis not present

## 2019-10-29 ENCOUNTER — Other Ambulatory Visit: Payer: Self-pay

## 2019-10-29 ENCOUNTER — Ambulatory Visit
Admission: RE | Admit: 2019-10-29 | Discharge: 2019-10-29 | Disposition: A | Discharge status: Home / Self Care | Payer: BC Managed Care – PPO | Source: Ambulatory Visit | Attending: Obstetrics and Gynecology | Admitting: Obstetrics and Gynecology

## 2019-10-29 DIAGNOSIS — E2839 Other primary ovarian failure: Secondary | ICD-10-CM

## 2019-10-29 DIAGNOSIS — M810 Age-related osteoporosis without current pathological fracture: Secondary | ICD-10-CM | POA: Diagnosis not present

## 2019-10-29 DIAGNOSIS — Z780 Asymptomatic menopausal state: Secondary | ICD-10-CM | POA: Diagnosis not present

## 2019-11-09 DIAGNOSIS — J301 Allergic rhinitis due to pollen: Secondary | ICD-10-CM | POA: Diagnosis not present

## 2019-11-09 DIAGNOSIS — J3089 Other allergic rhinitis: Secondary | ICD-10-CM | POA: Diagnosis not present

## 2019-11-29 DIAGNOSIS — H401131 Primary open-angle glaucoma, bilateral, mild stage: Secondary | ICD-10-CM | POA: Diagnosis not present

## 2019-11-29 DIAGNOSIS — H353131 Nonexudative age-related macular degeneration, bilateral, early dry stage: Secondary | ICD-10-CM | POA: Diagnosis not present

## 2019-11-29 DIAGNOSIS — H04123 Dry eye syndrome of bilateral lacrimal glands: Secondary | ICD-10-CM | POA: Diagnosis not present

## 2019-11-29 DIAGNOSIS — H2513 Age-related nuclear cataract, bilateral: Secondary | ICD-10-CM | POA: Diagnosis not present

## 2019-12-13 DIAGNOSIS — J3089 Other allergic rhinitis: Secondary | ICD-10-CM | POA: Diagnosis not present

## 2019-12-13 DIAGNOSIS — J301 Allergic rhinitis due to pollen: Secondary | ICD-10-CM | POA: Diagnosis not present

## 2019-12-19 ENCOUNTER — Ambulatory Visit: Payer: BC Managed Care – PPO

## 2019-12-25 ENCOUNTER — Ambulatory Visit: Payer: BC Managed Care – PPO

## 2019-12-30 ENCOUNTER — Ambulatory Visit: Payer: BC Managed Care – PPO

## 2020-01-11 DIAGNOSIS — M810 Age-related osteoporosis without current pathological fracture: Secondary | ICD-10-CM | POA: Diagnosis not present

## 2020-01-14 DIAGNOSIS — J3089 Other allergic rhinitis: Secondary | ICD-10-CM | POA: Diagnosis not present

## 2020-01-14 DIAGNOSIS — J301 Allergic rhinitis due to pollen: Secondary | ICD-10-CM | POA: Diagnosis not present

## 2020-01-24 DIAGNOSIS — J343 Hypertrophy of nasal turbinates: Secondary | ICD-10-CM | POA: Diagnosis not present

## 2020-01-24 DIAGNOSIS — J310 Chronic rhinitis: Secondary | ICD-10-CM | POA: Diagnosis not present

## 2020-01-24 DIAGNOSIS — R0982 Postnasal drip: Secondary | ICD-10-CM | POA: Diagnosis not present

## 2020-01-27 DIAGNOSIS — H401131 Primary open-angle glaucoma, bilateral, mild stage: Secondary | ICD-10-CM | POA: Diagnosis not present

## 2020-01-27 DIAGNOSIS — H04123 Dry eye syndrome of bilateral lacrimal glands: Secondary | ICD-10-CM | POA: Diagnosis not present

## 2020-02-04 DIAGNOSIS — E049 Nontoxic goiter, unspecified: Secondary | ICD-10-CM | POA: Diagnosis not present

## 2020-02-04 DIAGNOSIS — H04129 Dry eye syndrome of unspecified lacrimal gland: Secondary | ICD-10-CM | POA: Diagnosis not present

## 2020-02-04 DIAGNOSIS — M792 Neuralgia and neuritis, unspecified: Secondary | ICD-10-CM | POA: Diagnosis not present

## 2020-02-10 ENCOUNTER — Other Ambulatory Visit: Payer: Self-pay | Admitting: Endocrinology

## 2020-02-10 DIAGNOSIS — E049 Nontoxic goiter, unspecified: Secondary | ICD-10-CM

## 2020-02-11 DIAGNOSIS — J3089 Other allergic rhinitis: Secondary | ICD-10-CM | POA: Diagnosis not present

## 2020-02-11 DIAGNOSIS — J301 Allergic rhinitis due to pollen: Secondary | ICD-10-CM | POA: Diagnosis not present

## 2020-02-16 ENCOUNTER — Ambulatory Visit
Admission: RE | Admit: 2020-02-16 | Discharge: 2020-02-16 | Disposition: A | Discharge status: Home / Self Care | Payer: BC Managed Care – PPO | Source: Ambulatory Visit | Attending: Endocrinology | Admitting: Endocrinology

## 2020-02-16 ENCOUNTER — Other Ambulatory Visit: Payer: Self-pay

## 2020-02-16 DIAGNOSIS — E049 Nontoxic goiter, unspecified: Secondary | ICD-10-CM

## 2020-02-16 DIAGNOSIS — E041 Nontoxic single thyroid nodule: Secondary | ICD-10-CM | POA: Diagnosis not present

## 2020-03-10 DIAGNOSIS — J3089 Other allergic rhinitis: Secondary | ICD-10-CM | POA: Diagnosis not present

## 2020-03-10 DIAGNOSIS — J301 Allergic rhinitis due to pollen: Secondary | ICD-10-CM | POA: Diagnosis not present

## 2020-03-29 DIAGNOSIS — M20011 Mallet finger of right finger(s): Secondary | ICD-10-CM | POA: Diagnosis not present

## 2020-04-11 DIAGNOSIS — J3089 Other allergic rhinitis: Secondary | ICD-10-CM | POA: Diagnosis not present

## 2020-04-11 DIAGNOSIS — J301 Allergic rhinitis due to pollen: Secondary | ICD-10-CM | POA: Diagnosis not present

## 2020-05-02 DIAGNOSIS — I10 Essential (primary) hypertension: Secondary | ICD-10-CM | POA: Diagnosis not present

## 2020-05-02 DIAGNOSIS — M810 Age-related osteoporosis without current pathological fracture: Secondary | ICD-10-CM | POA: Diagnosis not present

## 2020-05-02 DIAGNOSIS — Z0000 Encounter for general adult medical examination without abnormal findings: Secondary | ICD-10-CM | POA: Diagnosis not present

## 2020-05-02 DIAGNOSIS — E782 Mixed hyperlipidemia: Secondary | ICD-10-CM | POA: Diagnosis not present

## 2020-05-02 DIAGNOSIS — K219 Gastro-esophageal reflux disease without esophagitis: Secondary | ICD-10-CM | POA: Diagnosis not present

## 2020-05-05 DIAGNOSIS — H04123 Dry eye syndrome of bilateral lacrimal glands: Secondary | ICD-10-CM | POA: Diagnosis not present

## 2020-05-05 DIAGNOSIS — H401131 Primary open-angle glaucoma, bilateral, mild stage: Secondary | ICD-10-CM | POA: Diagnosis not present

## 2020-05-15 DIAGNOSIS — J3089 Other allergic rhinitis: Secondary | ICD-10-CM | POA: Diagnosis not present

## 2020-05-15 DIAGNOSIS — J301 Allergic rhinitis due to pollen: Secondary | ICD-10-CM | POA: Diagnosis not present

## 2020-05-17 DIAGNOSIS — J3089 Other allergic rhinitis: Secondary | ICD-10-CM | POA: Diagnosis not present

## 2020-05-17 DIAGNOSIS — R0982 Postnasal drip: Secondary | ICD-10-CM | POA: Diagnosis not present

## 2020-05-17 DIAGNOSIS — J310 Chronic rhinitis: Secondary | ICD-10-CM | POA: Diagnosis not present

## 2020-05-17 DIAGNOSIS — J301 Allergic rhinitis due to pollen: Secondary | ICD-10-CM | POA: Diagnosis not present

## 2020-05-17 DIAGNOSIS — J343 Hypertrophy of nasal turbinates: Secondary | ICD-10-CM | POA: Diagnosis not present

## 2020-05-23 DIAGNOSIS — Z1331 Encounter for screening for depression: Secondary | ICD-10-CM | POA: Diagnosis not present

## 2020-05-23 DIAGNOSIS — I10 Essential (primary) hypertension: Secondary | ICD-10-CM | POA: Diagnosis not present

## 2020-05-23 DIAGNOSIS — E7849 Other hyperlipidemia: Secondary | ICD-10-CM | POA: Diagnosis not present

## 2020-05-23 DIAGNOSIS — M810 Age-related osteoporosis without current pathological fracture: Secondary | ICD-10-CM | POA: Diagnosis not present

## 2020-05-23 DIAGNOSIS — J302 Other seasonal allergic rhinitis: Secondary | ICD-10-CM | POA: Diagnosis not present

## 2020-06-07 ENCOUNTER — Telehealth: Payer: Self-pay | Admitting: Gastroenterology

## 2020-06-07 NOTE — Telephone Encounter (Signed)
Hey Dr Christella Hartigan, pt is being referred by [facility] for family hx of colon cancer.  Pt had a colonoscopy on 05/2012 and is not due for another until 2023.  Referring provider advised pt to not wait that long and to schedule one sooner, could you please advise on scheduling? Thank you!

## 2020-06-07 NOTE — Telephone Encounter (Signed)
I was not aware that she had a family history of colon cancer. 5 year recall may be more appropriate.  Could you find out who Tanya her family has had colon cancer and how old were they when they were diagnosed with it?    If she prefers to be seen Kemba the office to discuss the above that is OK, if that is the case please offer her my first available OV.  Thanks

## 2020-06-08 NOTE — Telephone Encounter (Signed)
OK, thanks.  I think she needs colonoscopy now, [facility].

## 2020-06-08 NOTE — Telephone Encounter (Signed)
Hey Dr Christella Hartigan, the pt states it was only her sister, she was diagnosed at 17 and passed away at 60 Akina 13-Mar-2011.

## 2020-06-09 NOTE — Telephone Encounter (Signed)
Hey Dr Christella Hartigan, this pt called back and states she made a mistake, her sister passed away from lung cancer and not colon cancer. She states she will keep her recall for a colonoscopy for 2023

## 2020-06-09 NOTE — Telephone Encounter (Signed)
Got it, thanks!

## 2020-06-14 DIAGNOSIS — J3089 Other allergic rhinitis: Secondary | ICD-10-CM | POA: Diagnosis not present

## 2020-06-14 DIAGNOSIS — J301 Allergic rhinitis due to pollen: Secondary | ICD-10-CM | POA: Diagnosis not present

## 2020-06-16 DIAGNOSIS — J3089 Other allergic rhinitis: Secondary | ICD-10-CM | POA: Diagnosis not present

## 2020-06-16 DIAGNOSIS — J301 Allergic rhinitis due to pollen: Secondary | ICD-10-CM | POA: Diagnosis not present

## 2020-06-20 DIAGNOSIS — J301 Allergic rhinitis due to pollen: Secondary | ICD-10-CM | POA: Diagnosis not present

## 2020-06-20 DIAGNOSIS — J3089 Other allergic rhinitis: Secondary | ICD-10-CM | POA: Diagnosis not present

## 2020-06-23 DIAGNOSIS — J3089 Other allergic rhinitis: Secondary | ICD-10-CM | POA: Diagnosis not present

## 2020-06-23 DIAGNOSIS — J301 Allergic rhinitis due to pollen: Secondary | ICD-10-CM | POA: Diagnosis not present

## 2020-06-27 DIAGNOSIS — M810 Age-related osteoporosis without current pathological fracture: Secondary | ICD-10-CM | POA: Diagnosis not present

## 2020-06-29 DIAGNOSIS — J3089 Other allergic rhinitis: Secondary | ICD-10-CM | POA: Diagnosis not present

## 2020-06-29 DIAGNOSIS — J301 Allergic rhinitis due to pollen: Secondary | ICD-10-CM | POA: Diagnosis not present

## 2020-07-28 DIAGNOSIS — J3089 Other allergic rhinitis: Secondary | ICD-10-CM | POA: Diagnosis not present

## 2020-07-28 DIAGNOSIS — J301 Allergic rhinitis due to pollen: Secondary | ICD-10-CM | POA: Diagnosis not present

## 2020-07-31 DIAGNOSIS — M810 Age-related osteoporosis without current pathological fracture: Secondary | ICD-10-CM | POA: Diagnosis not present

## 2020-08-03 DIAGNOSIS — M8580 Other specified disorders of bone density and structure, unspecified site: Secondary | ICD-10-CM | POA: Diagnosis not present

## 2020-08-04 DIAGNOSIS — Z1231 Encounter for screening mammogram for malignant neoplasm of breast: Secondary | ICD-10-CM | POA: Diagnosis not present

## 2020-09-01 DIAGNOSIS — H1045 Other chronic allergic conjunctivitis: Secondary | ICD-10-CM | POA: Diagnosis not present

## 2020-09-01 DIAGNOSIS — K219 Gastro-esophageal reflux disease without esophagitis: Secondary | ICD-10-CM | POA: Diagnosis not present

## 2020-09-01 DIAGNOSIS — J3089 Other allergic rhinitis: Secondary | ICD-10-CM | POA: Diagnosis not present

## 2020-09-01 DIAGNOSIS — J301 Allergic rhinitis due to pollen: Secondary | ICD-10-CM | POA: Diagnosis not present

## 2020-09-28 DIAGNOSIS — J301 Allergic rhinitis due to pollen: Secondary | ICD-10-CM | POA: Diagnosis not present

## 2020-09-28 DIAGNOSIS — J3089 Other allergic rhinitis: Secondary | ICD-10-CM | POA: Diagnosis not present

## 2020-10-05 DIAGNOSIS — H2513 Age-related nuclear cataract, bilateral: Secondary | ICD-10-CM | POA: Diagnosis not present

## 2020-10-05 DIAGNOSIS — H04123 Dry eye syndrome of bilateral lacrimal glands: Secondary | ICD-10-CM | POA: Diagnosis not present

## 2020-10-05 DIAGNOSIS — H401131 Primary open-angle glaucoma, bilateral, mild stage: Secondary | ICD-10-CM | POA: Diagnosis not present

## 2020-10-05 DIAGNOSIS — H25013 Cortical age-related cataract, bilateral: Secondary | ICD-10-CM | POA: Diagnosis not present

## 2020-10-20 DIAGNOSIS — R519 Headache, unspecified: Secondary | ICD-10-CM | POA: Diagnosis not present

## 2020-10-20 DIAGNOSIS — J019 Acute sinusitis, unspecified: Secondary | ICD-10-CM | POA: Diagnosis not present

## 2020-10-23 DIAGNOSIS — L720 Epidermal cyst: Secondary | ICD-10-CM | POA: Diagnosis not present

## 2020-10-31 DIAGNOSIS — J301 Allergic rhinitis due to pollen: Secondary | ICD-10-CM | POA: Diagnosis not present

## 2020-10-31 DIAGNOSIS — J3089 Other allergic rhinitis: Secondary | ICD-10-CM | POA: Diagnosis not present

## 2020-11-28 DIAGNOSIS — R0982 Postnasal drip: Secondary | ICD-10-CM | POA: Diagnosis not present

## 2020-11-28 DIAGNOSIS — J310 Chronic rhinitis: Secondary | ICD-10-CM | POA: Diagnosis not present

## 2020-11-28 DIAGNOSIS — J343 Hypertrophy of nasal turbinates: Secondary | ICD-10-CM | POA: Diagnosis not present

## 2020-11-30 DIAGNOSIS — J301 Allergic rhinitis due to pollen: Secondary | ICD-10-CM | POA: Diagnosis not present

## 2020-11-30 DIAGNOSIS — J3089 Other allergic rhinitis: Secondary | ICD-10-CM | POA: Diagnosis not present

## 2020-12-11 DIAGNOSIS — J301 Allergic rhinitis due to pollen: Secondary | ICD-10-CM | POA: Diagnosis not present

## 2020-12-11 DIAGNOSIS — J3089 Other allergic rhinitis: Secondary | ICD-10-CM | POA: Diagnosis not present

## 2020-12-14 DIAGNOSIS — N898 Other specified noninflammatory disorders of vagina: Secondary | ICD-10-CM | POA: Diagnosis not present

## 2020-12-14 DIAGNOSIS — N959 Unspecified menopausal and perimenopausal disorder: Secondary | ICD-10-CM | POA: Diagnosis not present

## 2020-12-28 DIAGNOSIS — J3089 Other allergic rhinitis: Secondary | ICD-10-CM | POA: Diagnosis not present

## 2020-12-28 DIAGNOSIS — J301 Allergic rhinitis due to pollen: Secondary | ICD-10-CM | POA: Diagnosis not present

## 2021-01-01 DIAGNOSIS — L723 Sebaceous cyst: Secondary | ICD-10-CM | POA: Diagnosis not present

## 2021-01-01 DIAGNOSIS — L720 Epidermal cyst: Secondary | ICD-10-CM | POA: Diagnosis not present

## 2021-01-16 DIAGNOSIS — M810 Age-related osteoporosis without current pathological fracture: Secondary | ICD-10-CM | POA: Diagnosis not present

## 2021-01-26 DIAGNOSIS — Z1151 Encounter for screening for human papillomavirus (HPV): Secondary | ICD-10-CM | POA: Diagnosis not present

## 2021-01-26 DIAGNOSIS — Z681 Body mass index (BMI) 19 or less, adult: Secondary | ICD-10-CM | POA: Diagnosis not present

## 2021-01-26 DIAGNOSIS — Z130 Encounter for screening for diseases of the blood and blood-forming organs and certain disorders involving the immune mechanism: Secondary | ICD-10-CM | POA: Diagnosis not present

## 2021-01-26 DIAGNOSIS — Z124 Encounter for screening for malignant neoplasm of cervix: Secondary | ICD-10-CM | POA: Diagnosis not present

## 2021-01-26 DIAGNOSIS — Z01419 Encounter for gynecological examination (general) (routine) without abnormal findings: Secondary | ICD-10-CM | POA: Diagnosis not present

## 2021-01-30 DIAGNOSIS — J301 Allergic rhinitis due to pollen: Secondary | ICD-10-CM | POA: Diagnosis not present

## 2021-01-30 DIAGNOSIS — J3089 Other allergic rhinitis: Secondary | ICD-10-CM | POA: Diagnosis not present

## 2021-02-06 DIAGNOSIS — H04123 Dry eye syndrome of bilateral lacrimal glands: Secondary | ICD-10-CM | POA: Diagnosis not present

## 2021-02-06 DIAGNOSIS — H401131 Primary open-angle glaucoma, bilateral, mild stage: Secondary | ICD-10-CM | POA: Diagnosis not present

## 2021-03-07 DIAGNOSIS — J3089 Other allergic rhinitis: Secondary | ICD-10-CM | POA: Diagnosis not present

## 2021-03-07 DIAGNOSIS — J301 Allergic rhinitis due to pollen: Secondary | ICD-10-CM | POA: Diagnosis not present

## 2021-04-03 DIAGNOSIS — J3089 Other allergic rhinitis: Secondary | ICD-10-CM | POA: Diagnosis not present

## 2021-04-03 DIAGNOSIS — J301 Allergic rhinitis due to pollen: Secondary | ICD-10-CM | POA: Diagnosis not present

## 2021-04-26 DIAGNOSIS — J301 Allergic rhinitis due to pollen: Secondary | ICD-10-CM | POA: Diagnosis not present

## 2021-04-26 DIAGNOSIS — J3089 Other allergic rhinitis: Secondary | ICD-10-CM | POA: Diagnosis not present

## 2021-05-24 DIAGNOSIS — E041 Nontoxic single thyroid nodule: Secondary | ICD-10-CM | POA: Diagnosis not present

## 2021-05-24 DIAGNOSIS — J3089 Other allergic rhinitis: Secondary | ICD-10-CM | POA: Diagnosis not present

## 2021-05-24 DIAGNOSIS — E785 Hyperlipidemia, unspecified: Secondary | ICD-10-CM | POA: Diagnosis not present

## 2021-05-24 DIAGNOSIS — J301 Allergic rhinitis due to pollen: Secondary | ICD-10-CM | POA: Diagnosis not present

## 2021-05-24 DIAGNOSIS — M810 Age-related osteoporosis without current pathological fracture: Secondary | ICD-10-CM | POA: Diagnosis not present

## 2021-05-29 DIAGNOSIS — H401131 Primary open-angle glaucoma, bilateral, mild stage: Secondary | ICD-10-CM | POA: Diagnosis not present

## 2021-05-29 DIAGNOSIS — J3089 Other allergic rhinitis: Secondary | ICD-10-CM | POA: Diagnosis not present

## 2021-05-29 DIAGNOSIS — H04123 Dry eye syndrome of bilateral lacrimal glands: Secondary | ICD-10-CM | POA: Diagnosis not present

## 2021-05-29 DIAGNOSIS — J301 Allergic rhinitis due to pollen: Secondary | ICD-10-CM | POA: Diagnosis not present

## 2021-05-31 DIAGNOSIS — J3089 Other allergic rhinitis: Secondary | ICD-10-CM | POA: Diagnosis not present

## 2021-05-31 DIAGNOSIS — I10 Essential (primary) hypertension: Secondary | ICD-10-CM | POA: Diagnosis not present

## 2021-05-31 DIAGNOSIS — Z0000 Encounter for general adult medical examination without abnormal findings: Secondary | ICD-10-CM | POA: Diagnosis not present

## 2021-05-31 DIAGNOSIS — E785 Hyperlipidemia, unspecified: Secondary | ICD-10-CM | POA: Diagnosis not present

## 2021-05-31 DIAGNOSIS — J301 Allergic rhinitis due to pollen: Secondary | ICD-10-CM | POA: Diagnosis not present

## 2021-05-31 DIAGNOSIS — Z1339 Encounter for screening examination for other mental health and behavioral disorders: Secondary | ICD-10-CM | POA: Diagnosis not present

## 2021-05-31 DIAGNOSIS — Z1331 Encounter for screening for depression: Secondary | ICD-10-CM | POA: Diagnosis not present

## 2021-06-05 DIAGNOSIS — J3089 Other allergic rhinitis: Secondary | ICD-10-CM | POA: Diagnosis not present

## 2021-06-05 DIAGNOSIS — J301 Allergic rhinitis due to pollen: Secondary | ICD-10-CM | POA: Diagnosis not present

## 2021-06-11 DIAGNOSIS — J301 Allergic rhinitis due to pollen: Secondary | ICD-10-CM | POA: Diagnosis not present

## 2021-06-11 DIAGNOSIS — J3089 Other allergic rhinitis: Secondary | ICD-10-CM | POA: Diagnosis not present

## 2021-07-03 DIAGNOSIS — J301 Allergic rhinitis due to pollen: Secondary | ICD-10-CM | POA: Diagnosis not present

## 2021-07-03 DIAGNOSIS — J3089 Other allergic rhinitis: Secondary | ICD-10-CM | POA: Diagnosis not present

## 2021-08-03 DIAGNOSIS — J301 Allergic rhinitis due to pollen: Secondary | ICD-10-CM | POA: Diagnosis not present

## 2021-08-03 DIAGNOSIS — J3089 Other allergic rhinitis: Secondary | ICD-10-CM | POA: Diagnosis not present

## 2021-08-07 DIAGNOSIS — Z1231 Encounter for screening mammogram for malignant neoplasm of breast: Secondary | ICD-10-CM | POA: Diagnosis not present

## 2021-08-20 DIAGNOSIS — M8580 Other specified disorders of bone density and structure, unspecified site: Secondary | ICD-10-CM | POA: Diagnosis not present

## 2021-08-23 DIAGNOSIS — M8580 Other specified disorders of bone density and structure, unspecified site: Secondary | ICD-10-CM | POA: Diagnosis not present

## 2021-08-31 DIAGNOSIS — J3089 Other allergic rhinitis: Secondary | ICD-10-CM | POA: Diagnosis not present

## 2021-08-31 DIAGNOSIS — J301 Allergic rhinitis due to pollen: Secondary | ICD-10-CM | POA: Diagnosis not present

## 2021-08-31 DIAGNOSIS — K219 Gastro-esophageal reflux disease without esophagitis: Secondary | ICD-10-CM | POA: Diagnosis not present

## 2021-08-31 DIAGNOSIS — H1045 Other chronic allergic conjunctivitis: Secondary | ICD-10-CM | POA: Diagnosis not present

## 2021-09-13 DIAGNOSIS — H0288B Meibomian gland dysfunction left eye, upper and lower eyelids: Secondary | ICD-10-CM | POA: Diagnosis not present

## 2021-09-13 DIAGNOSIS — H401130 Primary open-angle glaucoma, bilateral, stage unspecified: Secondary | ICD-10-CM | POA: Diagnosis not present

## 2021-09-13 DIAGNOSIS — H04123 Dry eye syndrome of bilateral lacrimal glands: Secondary | ICD-10-CM | POA: Diagnosis not present

## 2021-10-02 DIAGNOSIS — J3089 Other allergic rhinitis: Secondary | ICD-10-CM | POA: Diagnosis not present

## 2021-10-02 DIAGNOSIS — J301 Allergic rhinitis due to pollen: Secondary | ICD-10-CM | POA: Diagnosis not present

## 2021-10-12 DIAGNOSIS — J301 Allergic rhinitis due to pollen: Secondary | ICD-10-CM | POA: Diagnosis not present

## 2021-10-15 DIAGNOSIS — J3089 Other allergic rhinitis: Secondary | ICD-10-CM | POA: Diagnosis not present

## 2021-11-01 DIAGNOSIS — J3089 Other allergic rhinitis: Secondary | ICD-10-CM | POA: Diagnosis not present

## 2021-11-01 DIAGNOSIS — J301 Allergic rhinitis due to pollen: Secondary | ICD-10-CM | POA: Diagnosis not present

## 2021-12-03 DIAGNOSIS — J301 Allergic rhinitis due to pollen: Secondary | ICD-10-CM | POA: Diagnosis not present

## 2021-12-03 DIAGNOSIS — J3089 Other allergic rhinitis: Secondary | ICD-10-CM | POA: Diagnosis not present

## 2021-12-03 DIAGNOSIS — I10 Essential (primary) hypertension: Secondary | ICD-10-CM | POA: Diagnosis not present

## 2021-12-04 ENCOUNTER — Other Ambulatory Visit: Payer: Self-pay | Admitting: Adult Health

## 2021-12-04 DIAGNOSIS — E785 Hyperlipidemia, unspecified: Secondary | ICD-10-CM

## 2021-12-13 DIAGNOSIS — M79671 Pain in right foot: Secondary | ICD-10-CM | POA: Diagnosis not present

## 2021-12-13 DIAGNOSIS — M79672 Pain in left foot: Secondary | ICD-10-CM | POA: Diagnosis not present

## 2021-12-13 DIAGNOSIS — G609 Hereditary and idiopathic neuropathy, unspecified: Secondary | ICD-10-CM | POA: Diagnosis not present

## 2021-12-24 DIAGNOSIS — J343 Hypertrophy of nasal turbinates: Secondary | ICD-10-CM | POA: Diagnosis not present

## 2021-12-24 DIAGNOSIS — J342 Deviated nasal septum: Secondary | ICD-10-CM | POA: Diagnosis not present

## 2021-12-24 DIAGNOSIS — J310 Chronic rhinitis: Secondary | ICD-10-CM | POA: Diagnosis not present

## 2021-12-28 ENCOUNTER — Ambulatory Visit
Admission: RE | Admit: 2021-12-28 | Discharge: 2021-12-28 | Disposition: A | Discharge status: Home / Self Care | Payer: No Typology Code available for payment source | Source: Ambulatory Visit | Attending: Adult Health | Admitting: Adult Health

## 2021-12-28 DIAGNOSIS — E785 Hyperlipidemia, unspecified: Secondary | ICD-10-CM

## 2022-01-02 DIAGNOSIS — J3089 Other allergic rhinitis: Secondary | ICD-10-CM | POA: Diagnosis not present

## 2022-01-02 DIAGNOSIS — J301 Allergic rhinitis due to pollen: Secondary | ICD-10-CM | POA: Diagnosis not present

## 2022-01-02 DIAGNOSIS — J3081 Allergic rhinitis due to animal (cat) (dog) hair and dander: Secondary | ICD-10-CM | POA: Diagnosis not present

## 2022-01-24 DIAGNOSIS — G609 Hereditary and idiopathic neuropathy, unspecified: Secondary | ICD-10-CM | POA: Diagnosis not present

## 2022-01-29 DIAGNOSIS — J3081 Allergic rhinitis due to animal (cat) (dog) hair and dander: Secondary | ICD-10-CM | POA: Diagnosis not present

## 2022-01-29 DIAGNOSIS — J3089 Other allergic rhinitis: Secondary | ICD-10-CM | POA: Diagnosis not present

## 2022-01-29 DIAGNOSIS — J301 Allergic rhinitis due to pollen: Secondary | ICD-10-CM | POA: Diagnosis not present

## 2022-01-31 DIAGNOSIS — H353121 Nonexudative age-related macular degeneration, left eye, early dry stage: Secondary | ICD-10-CM | POA: Diagnosis not present

## 2022-01-31 DIAGNOSIS — H25813 Combined forms of age-related cataract, bilateral: Secondary | ICD-10-CM | POA: Diagnosis not present

## 2022-01-31 DIAGNOSIS — H04123 Dry eye syndrome of bilateral lacrimal glands: Secondary | ICD-10-CM | POA: Diagnosis not present

## 2022-01-31 DIAGNOSIS — H401131 Primary open-angle glaucoma, bilateral, mild stage: Secondary | ICD-10-CM | POA: Diagnosis not present

## 2022-02-05 DIAGNOSIS — J310 Chronic rhinitis: Secondary | ICD-10-CM | POA: Diagnosis not present

## 2022-02-05 DIAGNOSIS — J343 Hypertrophy of nasal turbinates: Secondary | ICD-10-CM | POA: Diagnosis not present

## 2022-02-05 DIAGNOSIS — J329 Chronic sinusitis, unspecified: Secondary | ICD-10-CM | POA: Diagnosis not present

## 2022-02-05 DIAGNOSIS — J342 Deviated nasal septum: Secondary | ICD-10-CM | POA: Diagnosis not present

## 2022-02-07 DIAGNOSIS — H04129 Dry eye syndrome of unspecified lacrimal gland: Secondary | ICD-10-CM | POA: Diagnosis not present

## 2022-02-14 DIAGNOSIS — M8580 Other specified disorders of bone density and structure, unspecified site: Secondary | ICD-10-CM | POA: Diagnosis not present

## 2022-02-20 DIAGNOSIS — M8580 Other specified disorders of bone density and structure, unspecified site: Secondary | ICD-10-CM | POA: Diagnosis not present

## 2022-03-04 DIAGNOSIS — J301 Allergic rhinitis due to pollen: Secondary | ICD-10-CM | POA: Diagnosis not present

## 2022-03-04 DIAGNOSIS — J3089 Other allergic rhinitis: Secondary | ICD-10-CM | POA: Diagnosis not present

## 2022-03-08 DIAGNOSIS — J301 Allergic rhinitis due to pollen: Secondary | ICD-10-CM | POA: Diagnosis not present

## 2022-03-08 DIAGNOSIS — J3089 Other allergic rhinitis: Secondary | ICD-10-CM | POA: Diagnosis not present

## 2022-04-02 DIAGNOSIS — J3089 Other allergic rhinitis: Secondary | ICD-10-CM | POA: Diagnosis not present

## 2022-04-02 DIAGNOSIS — J301 Allergic rhinitis due to pollen: Secondary | ICD-10-CM | POA: Diagnosis not present

## 2022-04-04 DIAGNOSIS — J301 Allergic rhinitis due to pollen: Secondary | ICD-10-CM | POA: Diagnosis not present

## 2022-04-04 DIAGNOSIS — J3089 Other allergic rhinitis: Secondary | ICD-10-CM | POA: Diagnosis not present

## 2022-04-09 ENCOUNTER — Encounter: Payer: Self-pay | Admitting: Gastroenterology

## 2022-04-26 DIAGNOSIS — J0101 Acute recurrent maxillary sinusitis: Secondary | ICD-10-CM | POA: Diagnosis not present

## 2022-05-03 DIAGNOSIS — J3089 Other allergic rhinitis: Secondary | ICD-10-CM | POA: Diagnosis not present

## 2022-05-03 DIAGNOSIS — J3081 Allergic rhinitis due to animal (cat) (dog) hair and dander: Secondary | ICD-10-CM | POA: Diagnosis not present

## 2022-05-03 DIAGNOSIS — J301 Allergic rhinitis due to pollen: Secondary | ICD-10-CM | POA: Diagnosis not present

## 2022-05-07 DIAGNOSIS — J3089 Other allergic rhinitis: Secondary | ICD-10-CM | POA: Diagnosis not present

## 2022-05-07 DIAGNOSIS — J3081 Allergic rhinitis due to animal (cat) (dog) hair and dander: Secondary | ICD-10-CM | POA: Diagnosis not present

## 2022-05-07 DIAGNOSIS — J301 Allergic rhinitis due to pollen: Secondary | ICD-10-CM | POA: Diagnosis not present

## 2022-05-09 DIAGNOSIS — R58 Hemorrhage, not elsewhere classified: Secondary | ICD-10-CM | POA: Diagnosis not present

## 2022-05-20 DIAGNOSIS — L720 Epidermal cyst: Secondary | ICD-10-CM | POA: Diagnosis not present

## 2022-05-20 DIAGNOSIS — L821 Other seborrheic keratosis: Secondary | ICD-10-CM | POA: Diagnosis not present

## 2022-05-29 DIAGNOSIS — E041 Nontoxic single thyroid nodule: Secondary | ICD-10-CM | POA: Diagnosis not present

## 2022-05-29 DIAGNOSIS — M810 Age-related osteoporosis without current pathological fracture: Secondary | ICD-10-CM | POA: Diagnosis not present

## 2022-05-29 DIAGNOSIS — E785 Hyperlipidemia, unspecified: Secondary | ICD-10-CM | POA: Diagnosis not present

## 2022-06-04 DIAGNOSIS — Z0000 Encounter for general adult medical examination without abnormal findings: Secondary | ICD-10-CM | POA: Diagnosis not present

## 2022-06-04 DIAGNOSIS — Z1339 Encounter for screening examination for other mental health and behavioral disorders: Secondary | ICD-10-CM | POA: Diagnosis not present

## 2022-06-04 DIAGNOSIS — J301 Allergic rhinitis due to pollen: Secondary | ICD-10-CM | POA: Diagnosis not present

## 2022-06-04 DIAGNOSIS — J3081 Allergic rhinitis due to animal (cat) (dog) hair and dander: Secondary | ICD-10-CM | POA: Diagnosis not present

## 2022-06-04 DIAGNOSIS — Z1331 Encounter for screening for depression: Secondary | ICD-10-CM | POA: Diagnosis not present

## 2022-06-04 DIAGNOSIS — R200 Anesthesia of skin: Secondary | ICD-10-CM | POA: Diagnosis not present

## 2022-06-04 DIAGNOSIS — J3089 Other allergic rhinitis: Secondary | ICD-10-CM | POA: Diagnosis not present

## 2022-06-04 DIAGNOSIS — E785 Hyperlipidemia, unspecified: Secondary | ICD-10-CM | POA: Diagnosis not present

## 2022-06-04 DIAGNOSIS — G2581 Restless legs syndrome: Secondary | ICD-10-CM | POA: Diagnosis not present

## 2022-06-18 DIAGNOSIS — M25531 Pain in right wrist: Secondary | ICD-10-CM | POA: Diagnosis not present

## 2022-06-25 DIAGNOSIS — M25531 Pain in right wrist: Secondary | ICD-10-CM | POA: Diagnosis not present

## 2022-07-05 DIAGNOSIS — J301 Allergic rhinitis due to pollen: Secondary | ICD-10-CM | POA: Diagnosis not present

## 2022-07-05 DIAGNOSIS — J3089 Other allergic rhinitis: Secondary | ICD-10-CM | POA: Diagnosis not present

## 2022-07-05 DIAGNOSIS — J3081 Allergic rhinitis due to animal (cat) (dog) hair and dander: Secondary | ICD-10-CM | POA: Diagnosis not present

## 2022-07-17 DIAGNOSIS — E785 Hyperlipidemia, unspecified: Secondary | ICD-10-CM | POA: Diagnosis not present

## 2022-07-17 DIAGNOSIS — R7989 Other specified abnormal findings of blood chemistry: Secondary | ICD-10-CM | POA: Diagnosis not present

## 2022-07-31 DIAGNOSIS — H401131 Primary open-angle glaucoma, bilateral, mild stage: Secondary | ICD-10-CM | POA: Diagnosis not present

## 2022-08-01 DIAGNOSIS — M25531 Pain in right wrist: Secondary | ICD-10-CM | POA: Diagnosis not present

## 2022-08-06 DIAGNOSIS — J3089 Other allergic rhinitis: Secondary | ICD-10-CM | POA: Diagnosis not present

## 2022-08-06 DIAGNOSIS — J301 Allergic rhinitis due to pollen: Secondary | ICD-10-CM | POA: Diagnosis not present

## 2022-08-06 DIAGNOSIS — J3081 Allergic rhinitis due to animal (cat) (dog) hair and dander: Secondary | ICD-10-CM | POA: Diagnosis not present

## 2022-08-13 DIAGNOSIS — J3089 Other allergic rhinitis: Secondary | ICD-10-CM | POA: Diagnosis not present

## 2022-08-13 DIAGNOSIS — J3081 Allergic rhinitis due to animal (cat) (dog) hair and dander: Secondary | ICD-10-CM | POA: Diagnosis not present

## 2022-08-13 DIAGNOSIS — J301 Allergic rhinitis due to pollen: Secondary | ICD-10-CM | POA: Diagnosis not present

## 2022-08-19 DIAGNOSIS — M65831 Other synovitis and tenosynovitis, right forearm: Secondary | ICD-10-CM | POA: Diagnosis not present

## 2022-08-19 DIAGNOSIS — M25531 Pain in right wrist: Secondary | ICD-10-CM | POA: Diagnosis not present

## 2022-08-20 DIAGNOSIS — J3081 Allergic rhinitis due to animal (cat) (dog) hair and dander: Secondary | ICD-10-CM | POA: Diagnosis not present

## 2022-08-20 DIAGNOSIS — J3089 Other allergic rhinitis: Secondary | ICD-10-CM | POA: Diagnosis not present

## 2022-08-20 DIAGNOSIS — J301 Allergic rhinitis due to pollen: Secondary | ICD-10-CM | POA: Diagnosis not present

## 2022-08-30 DIAGNOSIS — H6691 Otitis media, unspecified, right ear: Secondary | ICD-10-CM | POA: Diagnosis not present

## 2022-08-30 DIAGNOSIS — J3081 Allergic rhinitis due to animal (cat) (dog) hair and dander: Secondary | ICD-10-CM | POA: Diagnosis not present

## 2022-08-30 DIAGNOSIS — J3089 Other allergic rhinitis: Secondary | ICD-10-CM | POA: Diagnosis not present

## 2022-08-30 DIAGNOSIS — J301 Allergic rhinitis due to pollen: Secondary | ICD-10-CM | POA: Diagnosis not present

## 2022-09-09 DIAGNOSIS — M810 Age-related osteoporosis without current pathological fracture: Secondary | ICD-10-CM | POA: Diagnosis not present

## 2022-09-09 DIAGNOSIS — Z130 Encounter for screening for diseases of the blood and blood-forming organs and certain disorders involving the immune mechanism: Secondary | ICD-10-CM | POA: Diagnosis not present

## 2022-09-09 DIAGNOSIS — Z1231 Encounter for screening mammogram for malignant neoplasm of breast: Secondary | ICD-10-CM | POA: Diagnosis not present

## 2022-09-09 DIAGNOSIS — Z01411 Encounter for gynecological examination (general) (routine) with abnormal findings: Secondary | ICD-10-CM | POA: Diagnosis not present

## 2022-09-09 DIAGNOSIS — Z1389 Encounter for screening for other disorder: Secondary | ICD-10-CM | POA: Diagnosis not present

## 2022-09-16 DIAGNOSIS — J3089 Other allergic rhinitis: Secondary | ICD-10-CM | POA: Diagnosis not present

## 2022-09-16 DIAGNOSIS — J3081 Allergic rhinitis due to animal (cat) (dog) hair and dander: Secondary | ICD-10-CM | POA: Diagnosis not present

## 2022-09-16 DIAGNOSIS — J301 Allergic rhinitis due to pollen: Secondary | ICD-10-CM | POA: Diagnosis not present

## 2022-10-01 DIAGNOSIS — M810 Age-related osteoporosis without current pathological fracture: Secondary | ICD-10-CM | POA: Diagnosis not present

## 2022-10-03 ENCOUNTER — Encounter: Payer: Self-pay | Admitting: Gastroenterology

## 2022-10-07 DIAGNOSIS — H401111 Primary open-angle glaucoma, right eye, mild stage: Secondary | ICD-10-CM | POA: Diagnosis not present

## 2022-10-14 DIAGNOSIS — M25552 Pain in left hip: Secondary | ICD-10-CM | POA: Diagnosis not present

## 2022-10-14 DIAGNOSIS — M545 Low back pain, unspecified: Secondary | ICD-10-CM | POA: Diagnosis not present

## 2022-10-16 DIAGNOSIS — J310 Chronic rhinitis: Secondary | ICD-10-CM | POA: Diagnosis not present

## 2022-10-16 DIAGNOSIS — J3081 Allergic rhinitis due to animal (cat) (dog) hair and dander: Secondary | ICD-10-CM | POA: Diagnosis not present

## 2022-10-16 DIAGNOSIS — J301 Allergic rhinitis due to pollen: Secondary | ICD-10-CM | POA: Diagnosis not present

## 2022-10-16 DIAGNOSIS — J343 Hypertrophy of nasal turbinates: Secondary | ICD-10-CM | POA: Diagnosis not present

## 2022-10-16 DIAGNOSIS — J3089 Other allergic rhinitis: Secondary | ICD-10-CM | POA: Diagnosis not present

## 2022-10-16 DIAGNOSIS — J342 Deviated nasal septum: Secondary | ICD-10-CM | POA: Diagnosis not present

## 2022-11-14 DIAGNOSIS — J3081 Allergic rhinitis due to animal (cat) (dog) hair and dander: Secondary | ICD-10-CM | POA: Diagnosis not present

## 2022-11-14 DIAGNOSIS — J301 Allergic rhinitis due to pollen: Secondary | ICD-10-CM | POA: Diagnosis not present

## 2022-11-14 DIAGNOSIS — J3089 Other allergic rhinitis: Secondary | ICD-10-CM | POA: Diagnosis not present

## 2022-11-15 ENCOUNTER — Ambulatory Visit (AMBULATORY_SURGERY_CENTER): Payer: Medicare Other | Admitting: *Deleted

## 2022-11-15 VITALS — Ht 61.0 in | Wt 100.0 lb

## 2022-11-15 DIAGNOSIS — Z1211 Encounter for screening for malignant neoplasm of colon: Secondary | ICD-10-CM

## 2022-11-15 NOTE — Progress Notes (Signed)
No egg or soy allergy known to patient  No issues known to pt with past sedation with any surgeries or procedures Patient denies ever being told they had issues or difficulty with intubation  No FH of Malignant Hyperthermia Pt is not on diet pills Pt is not on  home 02  Pt is not on blood thinners  Pt denies issues with constipation  Pt is not on dialysis Pt denies any upcoming cardiac testing Pt encouraged to use to use Singlecare or Goodrx to reduce cost  Patient's chart reviewed by Cathlyn Parsons CNRA prior to previsit and patient appropriate for the [facility].  Previsit completed and red dot placed by patient's name on their procedure day (on provider's schedule).  . Pre-visit done by phone  Instructions sent by mail and my chart [phone] # given

## 2022-11-20 DIAGNOSIS — H401121 Primary open-angle glaucoma, left eye, mild stage: Secondary | ICD-10-CM | POA: Diagnosis not present

## 2022-11-28 DIAGNOSIS — H1045 Other chronic allergic conjunctivitis: Secondary | ICD-10-CM | POA: Diagnosis not present

## 2022-11-28 DIAGNOSIS — J3089 Other allergic rhinitis: Secondary | ICD-10-CM | POA: Diagnosis not present

## 2022-11-28 DIAGNOSIS — J301 Allergic rhinitis due to pollen: Secondary | ICD-10-CM | POA: Diagnosis not present

## 2022-12-03 DIAGNOSIS — J301 Allergic rhinitis due to pollen: Secondary | ICD-10-CM | POA: Diagnosis not present

## 2022-12-03 DIAGNOSIS — J3089 Other allergic rhinitis: Secondary | ICD-10-CM | POA: Diagnosis not present

## 2022-12-09 ENCOUNTER — Other Ambulatory Visit: Payer: Self-pay | Admitting: Family Medicine

## 2022-12-09 DIAGNOSIS — R1310 Dysphagia, unspecified: Secondary | ICD-10-CM | POA: Diagnosis not present

## 2022-12-09 DIAGNOSIS — H6123 Impacted cerumen, bilateral: Secondary | ICD-10-CM | POA: Diagnosis not present

## 2022-12-09 DIAGNOSIS — J3081 Allergic rhinitis due to animal (cat) (dog) hair and dander: Secondary | ICD-10-CM | POA: Diagnosis not present

## 2022-12-09 DIAGNOSIS — J301 Allergic rhinitis due to pollen: Secondary | ICD-10-CM | POA: Diagnosis not present

## 2022-12-09 DIAGNOSIS — R590 Localized enlarged lymph nodes: Secondary | ICD-10-CM | POA: Diagnosis not present

## 2022-12-09 DIAGNOSIS — J302 Other seasonal allergic rhinitis: Secondary | ICD-10-CM | POA: Diagnosis not present

## 2022-12-09 DIAGNOSIS — J3089 Other allergic rhinitis: Secondary | ICD-10-CM | POA: Diagnosis not present

## 2022-12-09 DIAGNOSIS — H938X3 Other specified disorders of ear, bilateral: Secondary | ICD-10-CM | POA: Diagnosis not present

## 2022-12-09 DIAGNOSIS — Z1152 Encounter for screening for COVID-19: Secondary | ICD-10-CM | POA: Diagnosis not present

## 2022-12-11 ENCOUNTER — Encounter: Payer: Self-pay | Admitting: Certified Registered Nurse Anesthetist

## 2022-12-11 ENCOUNTER — Encounter: Payer: Self-pay | Admitting: Gastroenterology

## 2022-12-11 DIAGNOSIS — J301 Allergic rhinitis due to pollen: Secondary | ICD-10-CM | POA: Diagnosis not present

## 2022-12-11 DIAGNOSIS — J3089 Other allergic rhinitis: Secondary | ICD-10-CM | POA: Diagnosis not present

## 2022-12-11 DIAGNOSIS — J3081 Allergic rhinitis due to animal (cat) (dog) hair and dander: Secondary | ICD-10-CM | POA: Diagnosis not present

## 2022-12-12 ENCOUNTER — Ambulatory Visit
Admission: RE | Admit: 2022-12-12 | Discharge: 2022-12-12 | Disposition: A | Discharge status: Home / Self Care | Payer: Medicare Other | Source: Ambulatory Visit | Attending: Family Medicine | Admitting: Family Medicine

## 2022-12-12 DIAGNOSIS — R590 Localized enlarged lymph nodes: Secondary | ICD-10-CM | POA: Diagnosis not present

## 2022-12-13 DIAGNOSIS — J3081 Allergic rhinitis due to animal (cat) (dog) hair and dander: Secondary | ICD-10-CM | POA: Diagnosis not present

## 2022-12-13 DIAGNOSIS — J3089 Other allergic rhinitis: Secondary | ICD-10-CM | POA: Diagnosis not present

## 2022-12-13 DIAGNOSIS — J301 Allergic rhinitis due to pollen: Secondary | ICD-10-CM | POA: Diagnosis not present

## 2022-12-17 ENCOUNTER — Encounter: Payer: BC Managed Care – PPO | Admitting: Gastroenterology

## 2022-12-17 DIAGNOSIS — J3081 Allergic rhinitis due to animal (cat) (dog) hair and dander: Secondary | ICD-10-CM | POA: Diagnosis not present

## 2022-12-17 DIAGNOSIS — J301 Allergic rhinitis due to pollen: Secondary | ICD-10-CM | POA: Diagnosis not present

## 2022-12-17 DIAGNOSIS — J3089 Other allergic rhinitis: Secondary | ICD-10-CM | POA: Diagnosis not present

## 2022-12-20 DIAGNOSIS — J301 Allergic rhinitis due to pollen: Secondary | ICD-10-CM | POA: Diagnosis not present

## 2022-12-20 DIAGNOSIS — J3081 Allergic rhinitis due to animal (cat) (dog) hair and dander: Secondary | ICD-10-CM | POA: Diagnosis not present

## 2022-12-20 DIAGNOSIS — J3089 Other allergic rhinitis: Secondary | ICD-10-CM | POA: Diagnosis not present

## 2022-12-23 DIAGNOSIS — J3089 Other allergic rhinitis: Secondary | ICD-10-CM | POA: Diagnosis not present

## 2022-12-23 DIAGNOSIS — J3081 Allergic rhinitis due to animal (cat) (dog) hair and dander: Secondary | ICD-10-CM | POA: Diagnosis not present

## 2022-12-23 DIAGNOSIS — J301 Allergic rhinitis due to pollen: Secondary | ICD-10-CM | POA: Diagnosis not present

## 2022-12-24 DIAGNOSIS — H9203 Otalgia, bilateral: Secondary | ICD-10-CM | POA: Diagnosis not present

## 2022-12-24 DIAGNOSIS — J342 Deviated nasal septum: Secondary | ICD-10-CM | POA: Diagnosis not present

## 2022-12-24 DIAGNOSIS — J3489 Other specified disorders of nose and nasal sinuses: Secondary | ICD-10-CM | POA: Diagnosis not present

## 2022-12-24 DIAGNOSIS — H938X3 Other specified disorders of ear, bilateral: Secondary | ICD-10-CM | POA: Diagnosis not present

## 2022-12-26 DIAGNOSIS — H401131 Primary open-angle glaucoma, bilateral, mild stage: Secondary | ICD-10-CM | POA: Diagnosis not present

## 2022-12-31 DIAGNOSIS — J3081 Allergic rhinitis due to animal (cat) (dog) hair and dander: Secondary | ICD-10-CM | POA: Diagnosis not present

## 2022-12-31 DIAGNOSIS — J3089 Other allergic rhinitis: Secondary | ICD-10-CM | POA: Diagnosis not present

## 2022-12-31 DIAGNOSIS — J301 Allergic rhinitis due to pollen: Secondary | ICD-10-CM | POA: Diagnosis not present

## 2023-01-02 DIAGNOSIS — J301 Allergic rhinitis due to pollen: Secondary | ICD-10-CM | POA: Diagnosis not present

## 2023-01-02 DIAGNOSIS — J3081 Allergic rhinitis due to animal (cat) (dog) hair and dander: Secondary | ICD-10-CM | POA: Diagnosis not present

## 2023-01-02 DIAGNOSIS — J3089 Other allergic rhinitis: Secondary | ICD-10-CM | POA: Diagnosis not present

## 2023-01-06 DIAGNOSIS — J301 Allergic rhinitis due to pollen: Secondary | ICD-10-CM | POA: Diagnosis not present

## 2023-01-06 DIAGNOSIS — J3089 Other allergic rhinitis: Secondary | ICD-10-CM | POA: Diagnosis not present

## 2023-01-06 DIAGNOSIS — J3081 Allergic rhinitis due to animal (cat) (dog) hair and dander: Secondary | ICD-10-CM | POA: Diagnosis not present

## 2023-01-08 ENCOUNTER — Ambulatory Visit: Payer: Medicare Other | Admitting: Gastroenterology

## 2023-01-08 ENCOUNTER — Encounter: Payer: Self-pay | Admitting: Gastroenterology

## 2023-01-08 VITALS — BP 122/68 | HR 85 | Ht 61.0 in | Wt 101.0 lb

## 2023-01-08 DIAGNOSIS — Z1211 Encounter for screening for malignant neoplasm of colon: Secondary | ICD-10-CM | POA: Diagnosis not present

## 2023-01-08 DIAGNOSIS — R1310 Dysphagia, unspecified: Secondary | ICD-10-CM

## 2023-01-08 DIAGNOSIS — K219 Gastro-esophageal reflux disease without esophagitis: Secondary | ICD-10-CM | POA: Diagnosis not present

## 2023-01-08 MED ORDER — NA SULFATE-K SULFATE-MG SULF 17.5-3.13-1.6 GM/177ML PO SOLN: 1.0000 | Freq: Once | ORAL | 0 refills | Status: AC

## 2023-01-08 NOTE — Progress Notes (Signed)
01/08/2023 Katrina Robertson [id number] Jan 10, 1956   HISTORY OF PRESENT ILLNESS: This is a 67 year old female who is a patient of Dr. Ardis Hughs, known to him only for colonoscopy Katrina Robertson July 2013 that was normal.  She would like to schedule screening colonoscopy, but she is here today also with complaints of dysphagia.  She says that she has been having trouble swallowing food.  She says that she had an episode Katrina Robertson January where she got a piece of chicken stuck.  Then she says that she felt like her throat and neck got swollen and that she had enlarged lymph nodes.  An ultrasound of her neck though did not show enlarged lymph nodes.  Says she has a lot of allergy issues as well.  Says that all this came out of nowhere.  Since then she has continued to feel that it is difficult to swallow.  She says that she cannot really taste food.  She does admit that she was using Tums quite regularly.  She was recently started on omeprazole 20 mg daily and feels like she has used less Tums since beginning that.   Past Medical History:  Diagnosis Date   Hypertension    Osteopenia    Past Surgical History:  Procedure Laterality Date   THYROIDECTOMY, PARTIAL  1990   TOTAL ABDOMINAL HYSTERECTOMY  1996    reports that she has never smoked. She has never used smokeless tobacco. She reports that she does not drink alcohol and does not use drugs. family history includes Cancer Katrina Robertson her sister; Stroke Katrina Robertson her father. Allergies  Allergen Reactions   Cefdinir Diarrhea   Gramineae Pollens Itching   Penicillins Nausea And Vomiting   Sulfa Antibiotics Nausea And Vomiting      Outpatient Encounter Medications as of 01/08/2023  Medication Sig   Calcium Carbonate (CALCIUM 600 PO) Take by mouth 2 (two) times daily.   EPINEPHrine 0.3 mg/0.3 mL IJ SOAJ injection INJECT AS DIRECTED AS NEEDED   levocetirizine (XYZAL) 5 MG tablet Take 1 tablet by mouth every evening.   metoprolol (LOPRESSOR) 50 MG tablet Take 50 mg by mouth 2  (two) times daily. Takes 1/2 tablet twice daily   Multiple Vitamin (MULTIVITAMIN) tablet Take 1 tablet by mouth daily.   Multiple Vitamins-Minerals (PRESERVISION AREDS PO) 2tabs PO QD   Na Sulfate-K Sulfate-Mg Sulf 17.5-3.13-1.6 GM/177ML SOLN Take 1 kit by mouth once for 1 dose.   omeprazole (PRILOSEC) 20 MG capsule Take 20 mg by mouth every morning.   PROLIA 60 MG/ML SOSY injection TO BE ADMINISTERED Katrina Robertson PHYSICIAN'S OFFICE. INJECT ONE SYRINGE SUBCUTANEOUSLY ONCE EVERY 6 MONTHS. REFRIGERATE. USE WITHIN 14 DAYS ONCE AT ROOM TEMPERATURE.   zolpidem (AMBIEN) 10 MG tablet TAKE 1/2 TO 1 (ONE-HALF TO ONE) TABLET BY MOUTH AT BEDTIME AS NEEDED FOR INSOMNIA   fluticasone (FLONASE) 50 MCG/ACT nasal spray Place into the nose.   [DISCONTINUED] aspirin 81 MG tablet Take 81 mg by mouth daily. (Patient not taking: Reported on 11/15/2022)   [DISCONTINUED] ketotifen (ZADITOR) 0.025 % ophthalmic solution 1 drop into affected eye   [DISCONTINUED] Vitamin D, Cholecalciferol, 400 units CAPS Take by mouth.   Facility-Administered Encounter Medications as of 01/08/2023  Medication   0.9 %  sodium chloride infusion     REVIEW OF SYSTEMS  : All other systems reviewed and negative except where noted Katrina Robertson the History of Present Illness.   PHYSICAL EXAM: BP 122/68   Pulse 85   Ht 5' 1"$  (1.549 m)  Wt 101 lb (45.8 kg)   SpO2 96%   BMI 19.08 kg/m  General: Well developed Asian female Katrina Robertson no acute distress Head: Normocephalic and atraumatic Eyes:  Sclerae anicteric, conjunctiva pink. Ears: Normal auditory acuity Lungs: Clear throughout to auscultation; no W/R/R. Heart: Regular rate and rhythm.  No M/R/G. Abdomen: Soft, non-distended.  BS present.  Non-tender. Rectal:  Will be done at the time of colonoscopy. Musculoskeletal: Symmetrical with no gross deformities  Skin: No lesions on visible extremities Extremities: No edema  Neurological: Alert oriented x 4, grossly non-focal Psychological:  Alert and  cooperative. Normal mood and affect  ASSESSMENT AND PLAN: *CRC screening:  Last colonoscopy was 05/2012.  Will schedule with Dr Silverio Decamp. *Dysphagia: Had sudden onset dysphagia with a piece of chicken Katrina Robertson January.  Has been having issues since then.  Also may be feels like she had been having reflux as she was using a lot of Tums.  Started omeprazole 20 mg daily a few weeks ago.  Will plan for EGD with possible dilation as well.  **The risks, benefits, and alternatives to EGD with dilation and colonoscopy were discussed with the patient and she consents to proceed.    CC:  Katrina Margarita, DO

## 2023-01-08 NOTE — Patient Instructions (Signed)
You have been scheduled for a colonoscopy. Please follow written instructions given to you at your visit today.  Please pick up your prep supplies at the pharmacy within the next 1-3 days. If you use inhalers (even only as needed), please bring them with you on the day of your procedure.  _______________________________________________________  If your blood pressure at your visit was 140/90 or greater, please contact your primary care physician to follow up on this.  _______________________________________________________  If you are age 62 or older, your body mass index should be between 23-30. Your Body mass index is 19.08 kg/m. If this is out of the aforementioned range listed, please consider follow up with your Primary Care Provider.  If you are age 16 or younger, your body mass index should be between 19-25. Your Body mass index is 19.08 kg/m. If this is out of the aformentioned range listed, please consider follow up with your Primary Care Provider.   ________________________________________________________  The [facility] providers would like to encourage you to use [facility] to communicate with providers for non-urgent requests or questions.  Due to long hold times on the telephone, sending your provider a message by [facility] may be a faster and more efficient way to get a response.  Please allow 48 business hours for a response.  Please remember that this is for non-urgent requests.  _______________________________________________________

## 2023-01-09 DIAGNOSIS — J3089 Other allergic rhinitis: Secondary | ICD-10-CM | POA: Diagnosis not present

## 2023-01-09 DIAGNOSIS — J3081 Allergic rhinitis due to animal (cat) (dog) hair and dander: Secondary | ICD-10-CM | POA: Diagnosis not present

## 2023-01-09 DIAGNOSIS — J301 Allergic rhinitis due to pollen: Secondary | ICD-10-CM | POA: Diagnosis not present

## 2023-01-14 DIAGNOSIS — J301 Allergic rhinitis due to pollen: Secondary | ICD-10-CM | POA: Diagnosis not present

## 2023-01-14 DIAGNOSIS — J3081 Allergic rhinitis due to animal (cat) (dog) hair and dander: Secondary | ICD-10-CM | POA: Diagnosis not present

## 2023-01-14 DIAGNOSIS — J3089 Other allergic rhinitis: Secondary | ICD-10-CM | POA: Diagnosis not present

## 2023-01-16 ENCOUNTER — Encounter: Payer: Self-pay | Admitting: Gastroenterology

## 2023-01-17 DIAGNOSIS — J3089 Other allergic rhinitis: Secondary | ICD-10-CM | POA: Diagnosis not present

## 2023-01-17 DIAGNOSIS — J301 Allergic rhinitis due to pollen: Secondary | ICD-10-CM | POA: Diagnosis not present

## 2023-01-20 DIAGNOSIS — J3081 Allergic rhinitis due to animal (cat) (dog) hair and dander: Secondary | ICD-10-CM | POA: Diagnosis not present

## 2023-01-20 DIAGNOSIS — J301 Allergic rhinitis due to pollen: Secondary | ICD-10-CM | POA: Diagnosis not present

## 2023-01-20 DIAGNOSIS — J3089 Other allergic rhinitis: Secondary | ICD-10-CM | POA: Diagnosis not present

## 2023-01-23 IMAGING — CT CT CARDIAC CORONARY ARTERY CALCIUM SCORE
3 series · 12 of 20 positions shown, 14 images · non-contrast
Comparison: None.

CLINICAL DATA: 65-year-old female with high cholesterol.

EXAM:
CT CARDIAC CORONARY ARTERY CALCIUM SCORE
TECHNIQUE: Non-contrast imaging through the heart was performed using
prospective ECG gating. Image post processing was performed on an
independent workstation, allowing for quantitative analysis of the
heart and coronary arteries. Note that this exam targets the heart
and the chest was not imaged in its entirety.

[Series 2: calcium scoring 2.00 qr36 bestdiast 70% hrt calciu · axial · 0.29mm/px · z∈[+1409,+1441]mm · 2 of 80 slices shown]
[im 16/80  vessel]
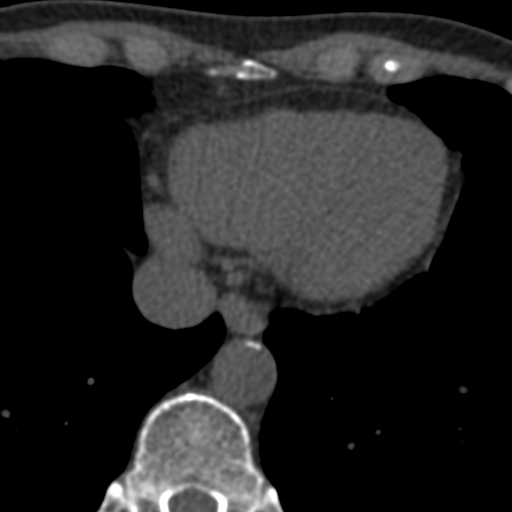
[im 32/80  vessel]
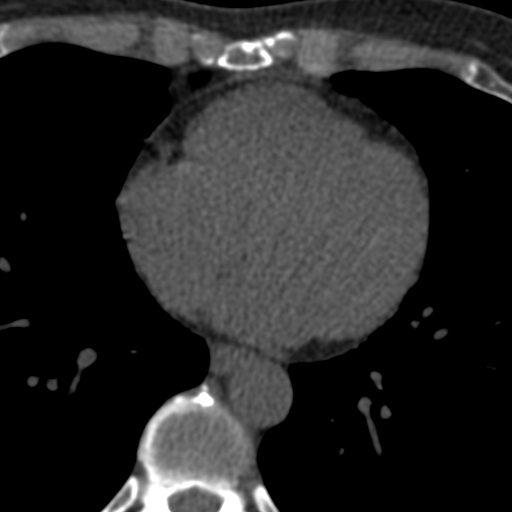

[Series 3: calcium scoring 2.00 br40 bestdiast 70% axial · axial · 0.42mm/px · z∈[+1405,+1509]mm · 5 of 80 slices shown, 7 images]
[im 14/80  vessel]
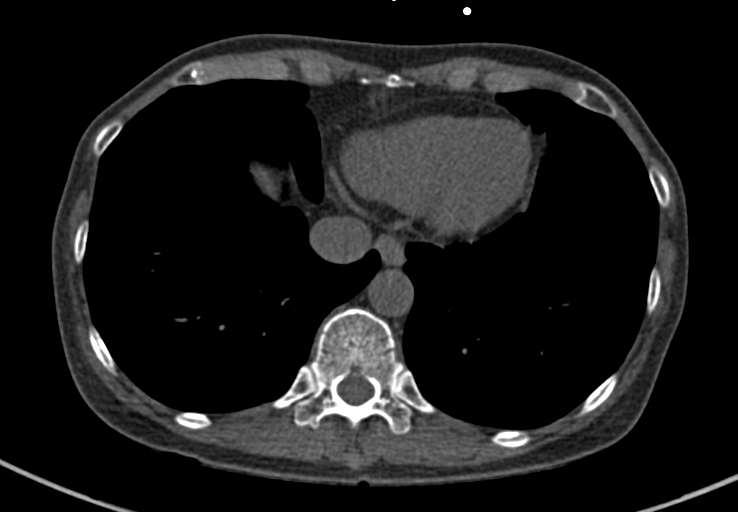
[im 14/80  lung]
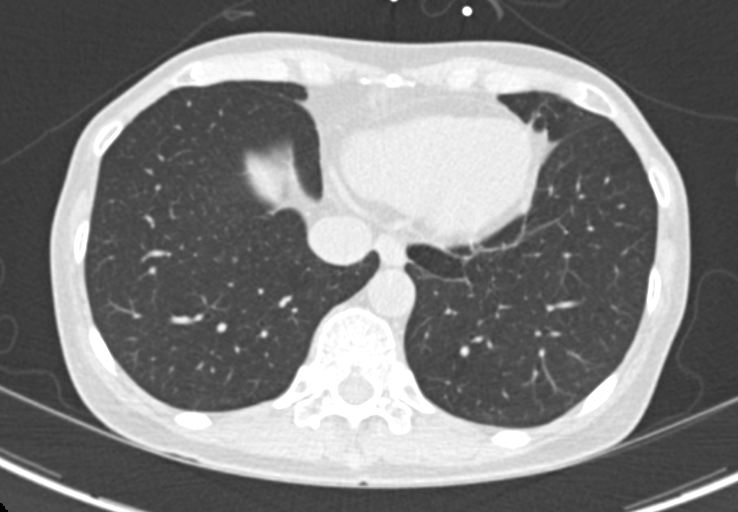
[im 27/80  vessel]
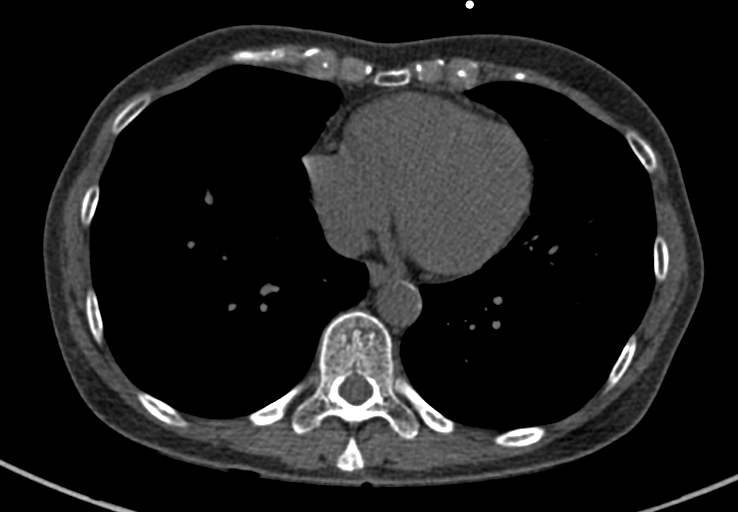
[im 40/80  vessel]
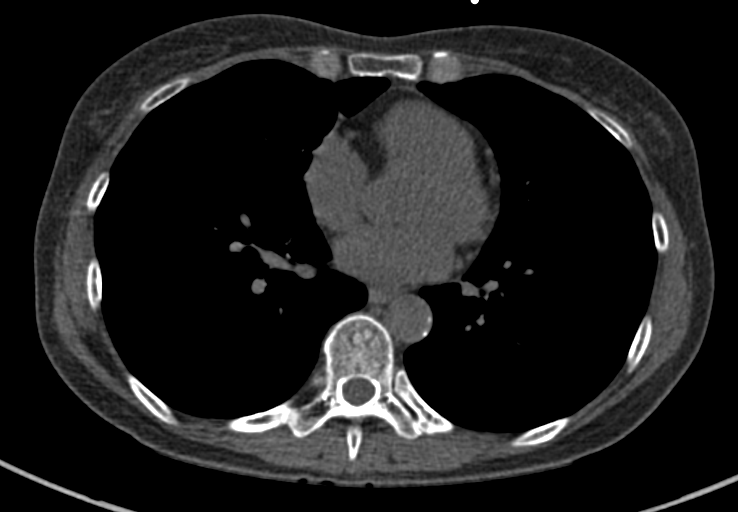
[im 53/80  vessel]
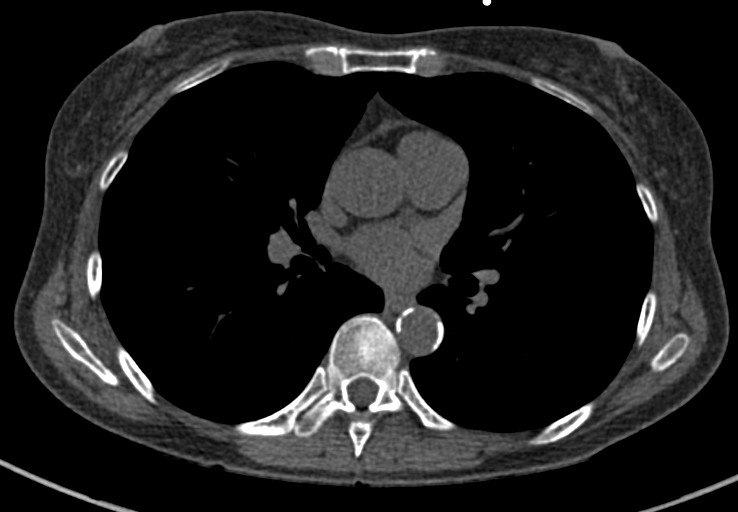
[im 66/80  vessel]
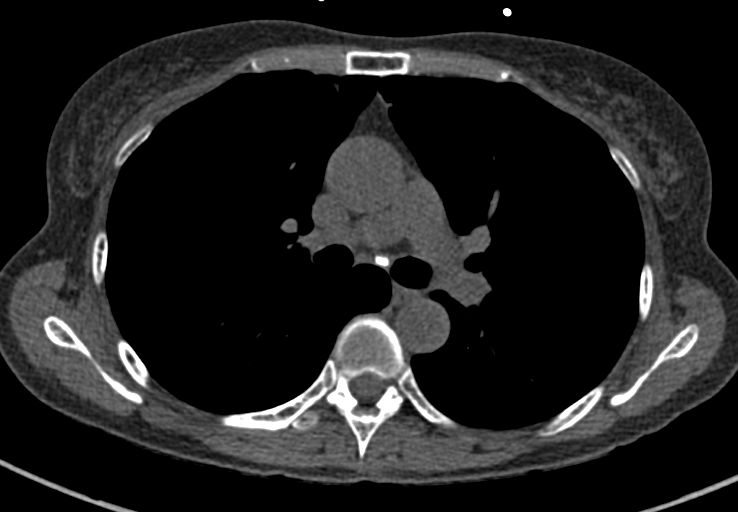
[im 66/80  lung]
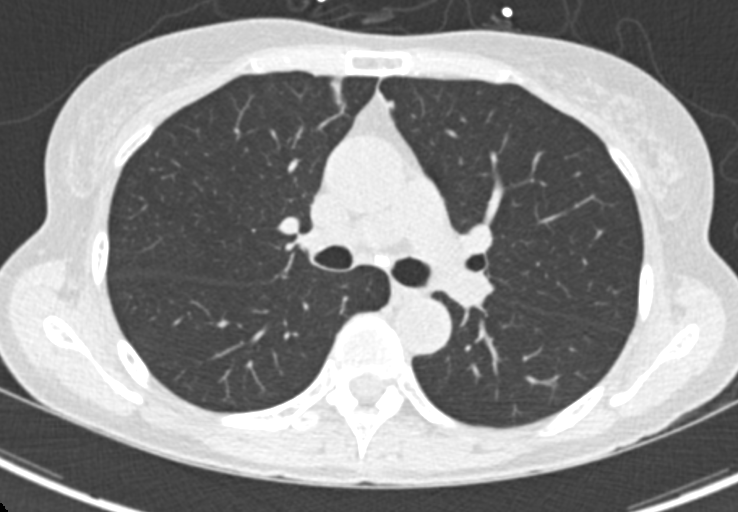

[Series 9: calcium scoring 2.00 br60 bestdiast 70% lungs · axial · 0.42mm/px · z∈[+1405,+1509]mm · 5 of 80 slices shown]
[im 14/80  vessel]
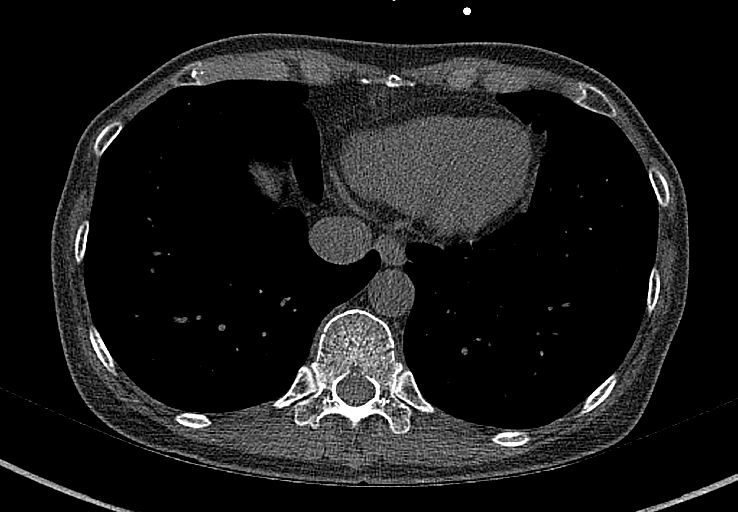
[im 27/80  vessel]
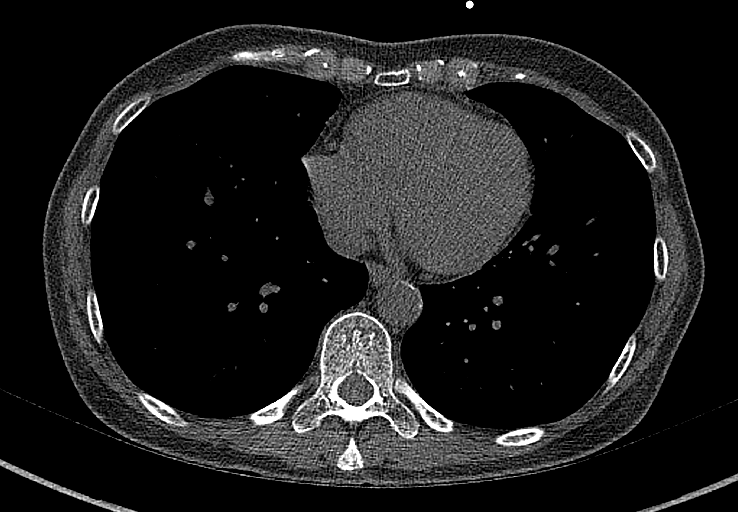
[im 40/80  vessel]
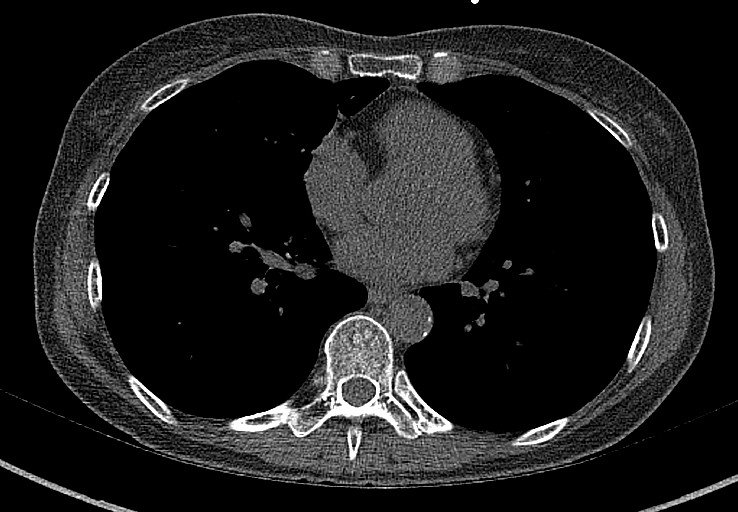
[im 53/80  vessel]
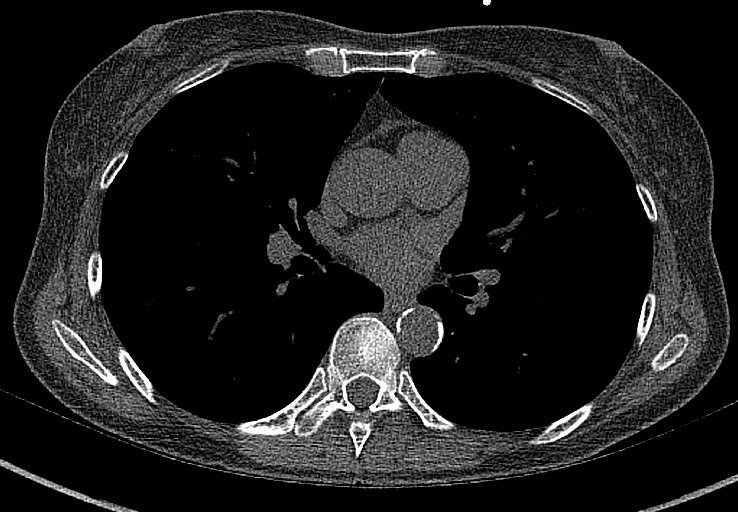
[im 66/80  vessel]
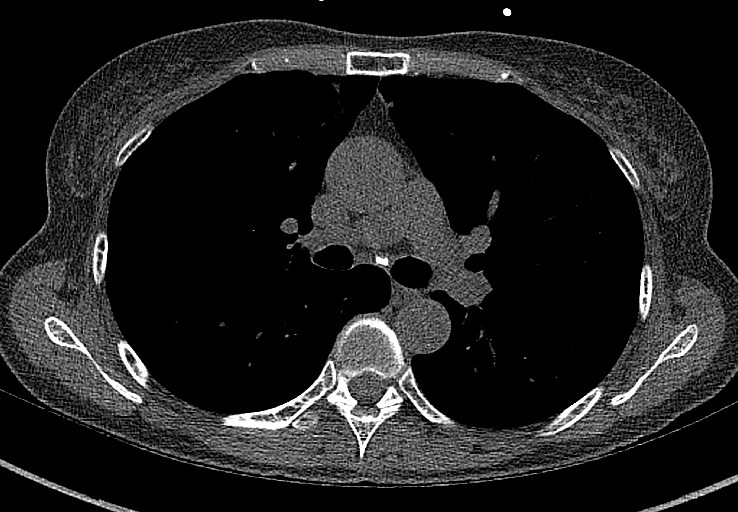

[12 of 20 positions shown; findings below may reference images not displayed]

FINDINGS: Technical quality: Good

CORONARY CALCIUM SCORES:

Left Main: No coronary artery calcification

LAD: No coronary calcification

LCx: No coronary calcification

RCA: No coronary calcification

CORONARY CALCIUM

Total Agatston Score: 0

[HOSPITAL] percentile: 0

Ascending aorta (normal <  40 mm): 29 mm.

Atherosclerotic calcification of the descending thoracic aorta.

EXTRACARDIAC FINDINGS:

Limited view of the lung parenchyma demonstrates no suspicious
nodularity. Airways are normal.

Limited view of the mediastinum demonstrates no adenopathy.
Esophagus normal.

Limited view of the upper abdomen is unremarkable.

Limited view of the skeleton and chest wall is unremarkable.
IMPRESSION: 1. No coronary artery calcification.

2. Total Agatston Score: 0

3. MESA age and sex matched database percentile: 0th

4.  Aortic Atherosclerosis (V2Z0W-EX5.5).

## 2023-01-24 ENCOUNTER — Encounter: Payer: Self-pay | Admitting: Gastroenterology

## 2023-01-24 ENCOUNTER — Ambulatory Visit (AMBULATORY_SURGERY_CENTER): Payer: Medicare Other | Admitting: Gastroenterology

## 2023-01-24 VITALS — BP 112/55 | HR 69 | Temp 97.1°F | Resp 12 | Ht 61.0 in | Wt 101.0 lb

## 2023-01-24 DIAGNOSIS — K219 Gastro-esophageal reflux disease without esophagitis: Secondary | ICD-10-CM

## 2023-01-24 DIAGNOSIS — K209 Esophagitis, unspecified without bleeding: Secondary | ICD-10-CM | POA: Diagnosis not present

## 2023-01-24 DIAGNOSIS — Z1211 Encounter for screening for malignant neoplasm of colon: Secondary | ICD-10-CM | POA: Diagnosis not present

## 2023-01-24 DIAGNOSIS — D122 Benign neoplasm of ascending colon: Secondary | ICD-10-CM | POA: Diagnosis not present

## 2023-01-24 DIAGNOSIS — K635 Polyp of colon: Secondary | ICD-10-CM

## 2023-01-24 DIAGNOSIS — D123 Benign neoplasm of transverse colon: Secondary | ICD-10-CM

## 2023-01-24 DIAGNOSIS — K2970 Gastritis, unspecified, without bleeding: Secondary | ICD-10-CM

## 2023-01-24 DIAGNOSIS — R1310 Dysphagia, unspecified: Secondary | ICD-10-CM

## 2023-01-24 MED ORDER — SODIUM CHLORIDE 0.9 % IV SOLN: 500.0000 mL | Freq: Once | INTRAVENOUS | Status: DC

## 2023-01-24 MED ORDER — OMEPRAZOLE 40 MG PO CPDR: DELAYED_RELEASE_CAPSULE | ORAL | 3 refills | Status: DC

## 2023-01-24 NOTE — Patient Instructions (Addendum)
Handout on hemorrhoids, polyps, gastritis, and esophagitis given to patient.  Await pathology results. Repeat colonoscopy Jacalyn 3-10 years for surveillance, will be determined based off of pathology results. Continue present medications.  Follow an antireflux regimen. Use Prilosec (omeprazole) 40 mg daily for 3 months and then decrease to 20 mg daily - pick up medication from [employer] off of [place]:   Refer to the procedure report that was given to you for any specific questions about what was found during the examination.  If the procedure report does not answer your questions, please call your gastroenterologist to clarify.  If you requested that your care partner not be given the details of your procedure findings, then the procedure report has been included Ellary a sealed envelope for you to review at your convenience later.  YOU SHOULD EXPECT: Some feelings of bloating Armani the abdomen. Passage of more gas than usual.  Walking can help get rid of the air that was put into your GI tract during the procedure and reduce the bloating. If you had a lower endoscopy (such as a colonoscopy or flexible sigmoidoscopy) you may notice spotting of blood Decklyn your stool or on the toilet paper. If you underwent a bowel prep for your procedure, you may not have a normal bowel movement for a few days.  Please Note:  You might notice some irritation and congestion Cassidy your nose or some drainage.  This is from the oxygen used during your procedure.  There is no need for concern and it should clear up Ahleah a day or so.  SYMPTOMS TO REPORT IMMEDIATELY:  Following lower endoscopy (colonoscopy or flexible sigmoidoscopy):  Excessive amounts of blood Ishia the stool  Significant tenderness or worsening of abdominal pains  Swelling of the abdomen that is new, acute  Fever of 100F or higher  Following upper endoscopy  (EGD)  Vomiting of blood or coffee ground material  New chest pain or pain under the shoulder blades  Painful or persistently difficult swallowing  New shortness of breath  Fever of 100F or higher  Black, tarry-looking stools  For urgent or emergent issues, a gastroenterologist can be reached at any hour by calling [phone]. Do not use MyChart messaging for urgent concerns.    DIET:  We do recommend a small meal at first, but then you may proceed to your regular diet.  Drink plenty of fluids but you should avoid alcoholic beverages for 24 hours.  ACTIVITY:  You should plan to take it easy for the rest of today and you should NOT DRIVE or use heavy machinery until tomorrow (because of the sedation medicines used during the test).    FOLLOW UP: Our staff will call the number listed on your records the next business day following your procedure.  We will call around 7:15- 8:00 am to check on you and address any questions or concerns that you may have regarding the information given to you following your procedure. If we do not reach you, we will leave a message.     If any biopsies were taken you will be contacted by phone or by letter within the next 1-3 weeks.  Please call us at [phone] if you have not heard about the biopsies Saharah 3 weeks.    SIGNATURES/CONFIDENTIALITY: You and/or your care partner have signed paperwork which will be entered into your electronic medical record.  These signatures attest to the fact that that the information above on your After Visit Summary has been reviewed and is understood.  Full responsibility of the confidentiality of this discharge information lies with you and/or your care-partner.

## 2023-01-24 NOTE — Progress Notes (Signed)
Please refer to office visit note 01/08/23 by Alonza Bogus. No additional changes Katrina Robertson H&P Patient is appropriate for planned procedure(s) and anesthesia Katrina Robertson an ambulatory setting  K. Denzil Magnuson , MD [phone]

## 2023-01-24 NOTE — Progress Notes (Signed)
Pt's states no medical or surgical changes since previsit or office visit. VS assessed by D.T 

## 2023-01-24 NOTE — Progress Notes (Signed)
Report to PACU, RN, vss, BBS= Clear.  

## 2023-01-24 NOTE — Progress Notes (Signed)
Called to room to assist during endoscopic procedure.  Patient ID and intended procedure confirmed with present staff. Received instructions for my participation Heidee the procedure from the performing physician.  

## 2023-01-24 NOTE — Op Note (Signed)
[place] Patient Name: Katrina Robertson Procedure Date: 01/24/2023 2:32 PM MRN: [id number] Endoscopist: Mauri Pole , MD, [id number] Age: 67 Referring MD:  Date of Birth: 08/28/1956 Gender: Female Account #: [id number] Procedure:                Upper GI endoscopy Indications:              Dysphagia Medicines:                Monitored Anesthesia Care Procedure:                Pre-Anesthesia Assessment:                           - Prior to the procedure, a History and Physical                            was performed, and patient medications and                            allergies were reviewed. The patient's tolerance of                            previous anesthesia was also reviewed. The risks                            and benefits of the procedure and the sedation                            options and risks were discussed with the patient.                            All questions were answered, and informed consent                            was obtained. Prior Anticoagulants: The patient has                            taken no anticoagulant or antiplatelet agents. ASA                            Grade Assessment: II - A patient with mild systemic                            disease. After reviewing the risks and benefits,                            the patient was deemed [name] satisfactory condition to                            undergo the procedure.                           After obtaining informed consent, the endoscope was  passed under direct vision. Throughout the                            procedure, the patient's blood pressure, pulse, and                            oxygen saturations were monitored continuously. The                            GIF [id number] was introduced through the                            mouth, and advanced to the second part of duodenum.                            The upper GI endoscopy was accomplished  without                            difficulty. The patient tolerated the procedure                            well. Scope Noah: Scope Out: Findings:                 LA Grade B (one or more mucosal breaks greater than                            5 mm, not extending between the tops of two mucosal                            folds) esophagitis with no bleeding was found 34 to                            36 cm from the incisors. Biopsies were obtained                            from the distal esophagus with cold forceps for                            histology of suspected eosinophilic esophagitis.                            The scope was withdrawn. Dilation was performed                            with a Maloney dilator with no resistance at 48 Fr.                            The dilation site was examined following endoscope                            reinsertion and showed no change.  The exam of the esophagus was otherwise normal.                           Patchy mild inflammation characterized by                            congestion (edema) and erythema was found [name] the                            entire examined stomach. Biopsies were taken with a                            cold forceps for Helicobacter pylori testing.                           The cardia and gastric fundus were normal on                            retroflexion.                           The examined duodenum was normal. Complications:            No immediate complications. Estimated Blood Loss:     Estimated blood loss was minimal. Impression:               - LA Grade B esophagitis with no bleeding. Dilated.                           - Gastritis. Biopsied.                           - Normal examined duodenum.                           - Biopsies were taken with a cold forceps for                            evaluation of eosinophilic esophagitis. Recommendation:           - Patient has a contact  number available for                            emergencies. The signs and symptoms of potential                            delayed complications were discussed with the                            patient. Return to normal activities tomorrow.                            Written discharge instructions were provided to the                            patient.                           -  Resume previous diet.                           - Continue present medications.                           - Await pathology results.                           - Follow an antireflux regimen.                           - Use Prilosec (omeprazole) 40 mg PO daily for 3                            months and then decrease to '20mg'$  daily. Mauri Pole, MD 01/24/2023 3:21:10 PM This report has been signed electronically.

## 2023-01-24 NOTE — Op Note (Signed)
[place] Patient Name: [facility] Procedure Date: 01/24/2023 2:32 PM MRN: [id number] Endoscopist: Mauri Pole , MD, [id number] Age: 67 Referring MD:  Date of Birth: 1956-05-02 Gender: Female Account #: [id number] Procedure:                Colonoscopy Indications:              Screening for colorectal malignant neoplasm Medicines:                Monitored Anesthesia Care Procedure:                Pre-Anesthesia Assessment:                           - Prior to the procedure, a History and Physical                            was performed, and patient medications and                            allergies were reviewed. The patient's tolerance of                            previous anesthesia was also reviewed. The risks                            and benefits of the procedure and the sedation                            options and risks were discussed with the patient.                            All questions were answered, and informed consent                            was obtained. Prior Anticoagulants: The patient has                            taken no anticoagulant or antiplatelet agents. ASA                            Grade Assessment: II - A patient with mild systemic                            disease. After reviewing the risks and benefits,                            the patient was deemed Jeannifer satisfactory condition to                            undergo the procedure.                           After obtaining informed consent, the colonoscope  was passed under direct vision. Throughout the                            procedure, the patient's blood pressure, pulse, and                            oxygen saturations were monitored continuously. The                            Olympus [id number]) Colonoscope was                            introduced through the anus and advanced to the the                            cecum,  identified by appendiceal orifice and                            ileocecal valve. The colonoscopy was performed                            without difficulty. The patient tolerated the                            procedure well. The quality of the bowel                            preparation was good. The ileocecal valve,                            appendiceal orifice, and rectum were photographed. Scope Aziya: 2:53:30 PM Scope Out: 3:05:03 PM Scope Withdrawal Time: 0 hours 9 minutes 2 seconds  Total Procedure Duration: 0 hours 11 minutes 33 seconds  Findings:                 The perianal and digital rectal examinations were                            normal.                           Four sessile polyps were found Aniylah the transverse                            colon and ascending colon. The polyps were 1 to 2                            mm Libi size. These polyps were removed with a cold                            biopsy forceps. Resection and retrieval were                            complete.  Non-bleeding external and internal hemorrhoids were                            found during retroflexion. The hemorrhoids were                            medium-sized.                           The exam was otherwise without abnormality. Complications:            No immediate complications. Estimated Blood Loss:     Estimated blood loss was minimal. Impression:               - Four 1 to 2 mm polyps Delmy the transverse colon and                            Avalyn the ascending colon, removed with a cold biopsy                            forceps. Resected and retrieved.                           - Non-bleeding external and internal hemorrhoids.                           - The examination was otherwise normal.                           - The GI Genius (intelligent endoscopy module),                            computer-aided polyp detection system powered by AI                             was utilized to detect colorectal polyps through                            enhanced visualization during colonoscopy. Recommendation:           - Patient has a contact number available for                            emergencies. The signs and symptoms of potential                            delayed complications were discussed with the                            patient. Return to normal activities tomorrow.                            Written discharge instructions were provided to the                            patient.                           -  Resume previous diet.                           - Continue present medications.                           - Await pathology results.                           - Repeat colonoscopy Fannye 3 - 10 years for                            surveillance based on pathology results. Mauri Pole, MD 01/24/2023 3:17:18 PM This report has been signed electronically.

## 2023-01-27 ENCOUNTER — Telehealth: Payer: Self-pay | Admitting: *Deleted

## 2023-01-27 DIAGNOSIS — J301 Allergic rhinitis due to pollen: Secondary | ICD-10-CM | POA: Diagnosis not present

## 2023-01-27 DIAGNOSIS — J3081 Allergic rhinitis due to animal (cat) (dog) hair and dander: Secondary | ICD-10-CM | POA: Diagnosis not present

## 2023-01-27 DIAGNOSIS — J3089 Other allergic rhinitis: Secondary | ICD-10-CM | POA: Diagnosis not present

## 2023-01-27 NOTE — Telephone Encounter (Signed)
  Follow up Call-     01/24/2023    1:57 PM  Call back number  Post procedure Call Back phone  # [phone]  Permission to leave phone message Yes     Post procedure follow up phone call. No answer at number given.  Left message on voicemail.

## 2023-01-31 ENCOUNTER — Encounter: Payer: Self-pay | Admitting: Gastroenterology

## 2023-02-04 DIAGNOSIS — J3081 Allergic rhinitis due to animal (cat) (dog) hair and dander: Secondary | ICD-10-CM | POA: Diagnosis not present

## 2023-02-04 DIAGNOSIS — J301 Allergic rhinitis due to pollen: Secondary | ICD-10-CM | POA: Diagnosis not present

## 2023-02-04 DIAGNOSIS — J3089 Other allergic rhinitis: Secondary | ICD-10-CM | POA: Diagnosis not present

## 2023-02-06 DIAGNOSIS — J3089 Other allergic rhinitis: Secondary | ICD-10-CM | POA: Diagnosis not present

## 2023-02-06 DIAGNOSIS — J301 Allergic rhinitis due to pollen: Secondary | ICD-10-CM | POA: Diagnosis not present

## 2023-02-07 ENCOUNTER — Telehealth: Payer: Self-pay | Admitting: Gastroenterology

## 2023-02-07 NOTE — Telephone Encounter (Signed)
PT had EGD/colonoscopy on 3/8 and is still having troule swallowing along with stomach pain and discomfort. She is looking for option for relief. Please advise.

## 2023-02-07 NOTE — Telephone Encounter (Signed)
Patient had an EGD 01/24/23. She is taking Omeprazole 40 mg every morning on an empty stomach. She calls today to say her swallowing is a little better. She continues to have "stomach pain and my food feels like it is not digesting." "About 2 hours after I eat I get indigestion." Patient reports difficulty passing her stool, stating she "constipated." She is drinking prune juice for this. Patient states "prune juice usually work for me." Please advise.

## 2023-02-10 DIAGNOSIS — J301 Allergic rhinitis due to pollen: Secondary | ICD-10-CM | POA: Diagnosis not present

## 2023-02-10 DIAGNOSIS — J3081 Allergic rhinitis due to animal (cat) (dog) hair and dander: Secondary | ICD-10-CM | POA: Diagnosis not present

## 2023-02-10 DIAGNOSIS — J3089 Other allergic rhinitis: Secondary | ICD-10-CM | POA: Diagnosis not present

## 2023-02-10 NOTE — Telephone Encounter (Signed)
Called patient and advised her to continue omeprazole 40 mg daily, 30 minutes before breakfast. Use Carafate suspension 1 g before meals and at bedtime as needed.  Please send prescription for 30 days Use dicyclomine 10 mg twice daily as needed for severe abdominal discomfort.  Continue with small frequent meals.  Explain the results and reassured her.  She was very appreciative of the phone call.  Please schedule office follow-up visit next available appointment Rielly 2 to 3 months.  Thank you

## 2023-02-10 NOTE — Telephone Encounter (Signed)
PT is calling for an update on her request. Please advise.

## 2023-02-11 ENCOUNTER — Other Ambulatory Visit: Payer: Self-pay

## 2023-02-11 NOTE — Telephone Encounter (Signed)
Appointment for 04/16/23 at 11:00 am scheduled. Letter to the patient with the appointment information.

## 2023-02-12 ENCOUNTER — Telehealth: Payer: Self-pay | Admitting: Gastroenterology

## 2023-02-12 MED ORDER — SUCRALFATE 1 GM/10ML PO SUSP: 1.0000 g | Freq: Three times a day (TID) | ORAL | 0 refills | Status: DC

## 2023-02-12 MED ORDER — DICYCLOMINE HCL 10 MG PO CAPS: 10.0000 mg | ORAL_CAPSULE | Freq: Two times a day (BID) | ORAL | 0 refills | Status: DC | PRN

## 2023-02-12 NOTE — Addendum Note (Signed)
Addended by: Virgina Evener A on: 02/12/2023 06:05 PM   Modules accepted: Orders

## 2023-02-12 NOTE — Telephone Encounter (Signed)
Inbound call from patient stated that Dr.Nandigam prescribe 2 medication but she have not receive them yet.Please advise

## 2023-02-12 NOTE — Telephone Encounter (Signed)
Beth, It looks like you had an orders only encounter for this patient from yesterday maybe the 2 medications she is talking about?  But I do not see what was ordered Zariah the encounter  Help

## 2023-02-12 NOTE — Telephone Encounter (Signed)
Sent to [facility].

## 2023-02-13 ENCOUNTER — Other Ambulatory Visit (HOSPITAL_COMMUNITY): Payer: Self-pay

## 2023-02-13 ENCOUNTER — Telehealth: Payer: Self-pay

## 2023-02-13 DIAGNOSIS — J3081 Allergic rhinitis due to animal (cat) (dog) hair and dander: Secondary | ICD-10-CM | POA: Diagnosis not present

## 2023-02-13 DIAGNOSIS — J301 Allergic rhinitis due to pollen: Secondary | ICD-10-CM | POA: Diagnosis not present

## 2023-02-13 DIAGNOSIS — J3089 Other allergic rhinitis: Secondary | ICD-10-CM | POA: Diagnosis not present

## 2023-02-13 NOTE — Telephone Encounter (Signed)
PA request received via CMM for Dicyclomine HCl 10MG  capsules  PA has been submitted to [facility] and is pending determination  Key: [id number]

## 2023-02-14 ENCOUNTER — Encounter: Payer: Self-pay | Admitting: Gastroenterology

## 2023-02-17 NOTE — Telephone Encounter (Signed)
PA has been denied due to:   We denied this request under Medicare Part D because Dicyclomine is not being prescribed for an FDA labeled or medically accepted use. A medically accepted use is approved by the FDA or supported by the [place] and the [place]. Panagiota this case, Dicyclomine is not being prescribed Johara accordance with an FDA labeled use or use accepted by the Medicare approved drug compendia.  *medication was submitted for spasms so not sure what else they wanted? May need DX of IBS but this was not mentioned Cylee patients chart.

## 2023-02-17 NOTE — Telephone Encounter (Signed)
Patient reports it will cost her $40 to purchase. She indicates this is obtainable for her.

## 2023-02-18 DIAGNOSIS — J301 Allergic rhinitis due to pollen: Secondary | ICD-10-CM | POA: Diagnosis not present

## 2023-02-18 DIAGNOSIS — J3089 Other allergic rhinitis: Secondary | ICD-10-CM | POA: Diagnosis not present

## 2023-02-20 DIAGNOSIS — J3081 Allergic rhinitis due to animal (cat) (dog) hair and dander: Secondary | ICD-10-CM | POA: Diagnosis not present

## 2023-02-20 DIAGNOSIS — J301 Allergic rhinitis due to pollen: Secondary | ICD-10-CM | POA: Diagnosis not present

## 2023-02-20 DIAGNOSIS — J3089 Other allergic rhinitis: Secondary | ICD-10-CM | POA: Diagnosis not present

## 2023-02-25 DIAGNOSIS — J3081 Allergic rhinitis due to animal (cat) (dog) hair and dander: Secondary | ICD-10-CM | POA: Diagnosis not present

## 2023-02-25 DIAGNOSIS — J3089 Other allergic rhinitis: Secondary | ICD-10-CM | POA: Diagnosis not present

## 2023-02-25 DIAGNOSIS — J301 Allergic rhinitis due to pollen: Secondary | ICD-10-CM | POA: Diagnosis not present

## 2023-02-27 DIAGNOSIS — J3081 Allergic rhinitis due to animal (cat) (dog) hair and dander: Secondary | ICD-10-CM | POA: Diagnosis not present

## 2023-02-27 DIAGNOSIS — J3089 Other allergic rhinitis: Secondary | ICD-10-CM | POA: Diagnosis not present

## 2023-02-27 DIAGNOSIS — J301 Allergic rhinitis due to pollen: Secondary | ICD-10-CM | POA: Diagnosis not present

## 2023-03-04 NOTE — Progress Notes (Signed)
Reviewed and agree with documentation and assessment and plan. K. Veena Owin Vignola , MD   

## 2023-03-05 DIAGNOSIS — J3081 Allergic rhinitis due to animal (cat) (dog) hair and dander: Secondary | ICD-10-CM | POA: Diagnosis not present

## 2023-03-05 DIAGNOSIS — J3089 Other allergic rhinitis: Secondary | ICD-10-CM | POA: Diagnosis not present

## 2023-03-05 DIAGNOSIS — J301 Allergic rhinitis due to pollen: Secondary | ICD-10-CM | POA: Diagnosis not present

## 2023-03-07 DIAGNOSIS — J3089 Other allergic rhinitis: Secondary | ICD-10-CM | POA: Diagnosis not present

## 2023-03-07 DIAGNOSIS — J3081 Allergic rhinitis due to animal (cat) (dog) hair and dander: Secondary | ICD-10-CM | POA: Diagnosis not present

## 2023-03-07 DIAGNOSIS — J301 Allergic rhinitis due to pollen: Secondary | ICD-10-CM | POA: Diagnosis not present

## 2023-03-11 DIAGNOSIS — J3081 Allergic rhinitis due to animal (cat) (dog) hair and dander: Secondary | ICD-10-CM | POA: Diagnosis not present

## 2023-03-11 DIAGNOSIS — J301 Allergic rhinitis due to pollen: Secondary | ICD-10-CM | POA: Diagnosis not present

## 2023-03-11 DIAGNOSIS — J3089 Other allergic rhinitis: Secondary | ICD-10-CM | POA: Diagnosis not present

## 2023-03-13 DIAGNOSIS — J3089 Other allergic rhinitis: Secondary | ICD-10-CM | POA: Diagnosis not present

## 2023-03-13 DIAGNOSIS — J3081 Allergic rhinitis due to animal (cat) (dog) hair and dander: Secondary | ICD-10-CM | POA: Diagnosis not present

## 2023-03-13 DIAGNOSIS — J301 Allergic rhinitis due to pollen: Secondary | ICD-10-CM | POA: Diagnosis not present

## 2023-03-20 DIAGNOSIS — J3081 Allergic rhinitis due to animal (cat) (dog) hair and dander: Secondary | ICD-10-CM | POA: Diagnosis not present

## 2023-03-20 DIAGNOSIS — J3089 Other allergic rhinitis: Secondary | ICD-10-CM | POA: Diagnosis not present

## 2023-03-20 DIAGNOSIS — J301 Allergic rhinitis due to pollen: Secondary | ICD-10-CM | POA: Diagnosis not present

## 2023-03-26 DIAGNOSIS — J301 Allergic rhinitis due to pollen: Secondary | ICD-10-CM | POA: Diagnosis not present

## 2023-03-26 DIAGNOSIS — J3089 Other allergic rhinitis: Secondary | ICD-10-CM | POA: Diagnosis not present

## 2023-03-26 DIAGNOSIS — J3081 Allergic rhinitis due to animal (cat) (dog) hair and dander: Secondary | ICD-10-CM | POA: Diagnosis not present

## 2023-04-02 DIAGNOSIS — M810 Age-related osteoporosis without current pathological fracture: Secondary | ICD-10-CM | POA: Diagnosis not present

## 2023-04-03 DIAGNOSIS — J301 Allergic rhinitis due to pollen: Secondary | ICD-10-CM | POA: Diagnosis not present

## 2023-04-03 DIAGNOSIS — J3089 Other allergic rhinitis: Secondary | ICD-10-CM | POA: Diagnosis not present

## 2023-04-09 ENCOUNTER — Other Ambulatory Visit: Payer: Self-pay | Admitting: Obstetrics and Gynecology

## 2023-04-09 DIAGNOSIS — M810 Age-related osteoporosis without current pathological fracture: Secondary | ICD-10-CM

## 2023-04-10 DIAGNOSIS — J3089 Other allergic rhinitis: Secondary | ICD-10-CM | POA: Diagnosis not present

## 2023-04-10 DIAGNOSIS — J301 Allergic rhinitis due to pollen: Secondary | ICD-10-CM | POA: Diagnosis not present

## 2023-04-11 DIAGNOSIS — H04123 Dry eye syndrome of bilateral lacrimal glands: Secondary | ICD-10-CM | POA: Diagnosis not present

## 2023-04-11 DIAGNOSIS — H25813 Combined forms of age-related cataract, bilateral: Secondary | ICD-10-CM | POA: Diagnosis not present

## 2023-04-11 DIAGNOSIS — H353131 Nonexudative age-related macular degeneration, bilateral, early dry stage: Secondary | ICD-10-CM | POA: Diagnosis not present

## 2023-04-11 DIAGNOSIS — H401131 Primary open-angle glaucoma, bilateral, mild stage: Secondary | ICD-10-CM | POA: Diagnosis not present

## 2023-04-16 ENCOUNTER — Ambulatory Visit: Payer: Medicare Other | Admitting: Gastroenterology

## 2023-04-16 ENCOUNTER — Ambulatory Visit
Admission: RE | Admit: 2023-04-16 | Discharge: 2023-04-16 | Disposition: A | Discharge status: Home / Self Care | Payer: Medicare Other | Source: Ambulatory Visit | Attending: Obstetrics and Gynecology | Admitting: Obstetrics and Gynecology

## 2023-04-16 ENCOUNTER — Encounter: Payer: Self-pay | Admitting: Gastroenterology

## 2023-04-16 VITALS — BP 90/60 | HR 64 | Ht 61.0 in | Wt 99.8 lb

## 2023-04-16 DIAGNOSIS — N951 Menopausal and female climacteric states: Secondary | ICD-10-CM | POA: Diagnosis not present

## 2023-04-16 DIAGNOSIS — J3089 Other allergic rhinitis: Secondary | ICD-10-CM | POA: Diagnosis not present

## 2023-04-16 DIAGNOSIS — R1013 Epigastric pain: Secondary | ICD-10-CM | POA: Diagnosis not present

## 2023-04-16 DIAGNOSIS — K5904 Chronic idiopathic constipation: Secondary | ICD-10-CM

## 2023-04-16 DIAGNOSIS — E2839 Other primary ovarian failure: Secondary | ICD-10-CM | POA: Diagnosis not present

## 2023-04-16 DIAGNOSIS — Z90722 Acquired absence of ovaries, bilateral: Secondary | ICD-10-CM | POA: Diagnosis not present

## 2023-04-16 DIAGNOSIS — K219 Gastro-esophageal reflux disease without esophagitis: Secondary | ICD-10-CM | POA: Diagnosis not present

## 2023-04-16 DIAGNOSIS — M810 Age-related osteoporosis without current pathological fracture: Secondary | ICD-10-CM

## 2023-04-16 DIAGNOSIS — J301 Allergic rhinitis due to pollen: Secondary | ICD-10-CM | POA: Diagnosis not present

## 2023-04-16 DIAGNOSIS — R1011 Right upper quadrant pain: Secondary | ICD-10-CM | POA: Diagnosis not present

## 2023-04-16 DIAGNOSIS — Z90710 Acquired absence of both cervix and uterus: Secondary | ICD-10-CM | POA: Diagnosis not present

## 2023-04-16 MED ORDER — FAMOTIDINE 20 MG PO TABS: 20.0000 mg | ORAL_TABLET | Freq: Every day | ORAL | 3 refills | Status: DC

## 2023-04-16 MED ORDER — OMEPRAZOLE 40 MG PO CPDR: 40.0000 mg | DELAYED_RELEASE_CAPSULE | Freq: Every day | ORAL | 3 refills | Status: DC

## 2023-04-16 NOTE — Progress Notes (Signed)
Katrina Robertson    [id number]    03/30/1956  Primary Care Physician:Skakle, Eliberto Ivory, DO  Referring Physician: Charlane Ferretti, DO [address] [place],  [place] [address]   Chief complaint:  Chief Complaint  Patient presents with   Dysphagia    Patient has no problem swallowing now, however she complains of epigastric discomfort after meals and a foul taste Ynez mouth.    HPI: Katrina Robertson is a 67 y.o. female presenting to clinic today for a follow up for dysphagia .  Today, she states that her dysphasia problems have resolved. She complains of epigastric discomfort that waxes and wanes. She states that she typically experiences the discomfort when having certain foods such as pizza. Her discomfort is most noticeable about 2 hours after having a meal. She also complains of a foul taste Orlanda her mouth.   She denies having a cholecystectomy.   She also report being constipated and reports that she typically has a BM once a day but feels as it she's not completely evacuated. She denies taking any OTC laxatives or stool softener  she denies diarrhea, nausea, blood Cortez stool, black stool, vomiting, bloating, unintentional weight loss, reflux, dysphagia.  GI Hx:  EGD 01-24-23 - LA Grade B esophagitis with no bleeding. Dilated.  - Gastritis. Biopsied.  - Normal examined duodenum.  - Biopsies were taken with a cold forceps for evaluation of eosinophilic esophagitis. 1. Surgical [P], gastric antrum and gastric body - GASTRIC ANTRAL AND OXYNTIC MUCOSA WITH NO SPECIFIC HISTOPATHOLOGIC CHANGES - HELICOBACTER PYLORI-LIKE ORGANISMS ARE NOT IDENTIFIED ON ROUTINE H&E STAIN 2. Surgical [P], distal esophagus - ESOPHAGEAL SQUAMOUS AND CARDIAC MUCOSA WITH REFLUX-ASSOCIATED CHANGES - NEGATIVE FOR INTESTINAL METAPLASIA OR DYSPLASIA  Colonoscopy 01-24-23 - Four 1 to 2 mm polyps Mirha the transverse colon and Kysha the ascending colon, removed with a cold biopsy forceps. Resected and retrieved.  -  Non-bleeding external and internal hemorrhoids.  - The examination was otherwise normal. - The GI Genius (intelligent endoscopy module), computer-aided polyp detection system powered by AI was utilized to detect colorectal polyps through enhanced visualization during colonoscopy. 3. 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-GRADE DYSPLASIA OR MALIGNANCY - OTHER FRAGMENT OF POLYPOID COLONIC MUCOSA WITH PROMINENT LYMPHOID AGGREGATES  Colonoscopy 06-12-12 Normal     Current Outpatient Medications:    Calcium Carbonate (CALCIUM 600 PO), Take by mouth 2 (two) times daily., Disp: , Rfl:    dicyclomine (BENTYL) 10 MG capsule, Take 1 capsule (10 mg total) by mouth 2 (two) times daily as needed for spasms., Disp: 90 capsule, Rfl: 0   EPINEPHrine 0.3 mg/0.3 mL IJ SOAJ injection, , Disp: , Rfl:    levocetirizine (XYZAL) 5 MG tablet, Take 1 tablet by mouth every evening., Disp: , Rfl:    metoprolol (LOPRESSOR) 50 MG tablet, Take 50 mg by mouth 2 (two) times daily. Takes 1/2 tablet twice daily, Disp: , Rfl:    Multiple Vitamin (MULTIVITAMIN) tablet, Take 1 tablet by mouth daily., Disp: , Rfl:    Multiple Vitamins-Minerals (PRESERVISION AREDS PO), 2tabs PO QD, Disp: , Rfl:    omeprazole (PRILOSEC) 40 MG capsule, Use Prilosec 40 mg PO daily for 3 months and then decrease to 20 mg daily, Disp: 90 capsule, Rfl: 3   PROLIA 60 MG/ML SOSY injection, , Disp: , Rfl:    sucralfate (CARAFATE) 1 GM/10ML suspension, Take 10 mLs (1 g total) by mouth 4 (  four) times daily -  with meals and at bedtime., Disp: 420 mL, Rfl: 0   zolpidem (AMBIEN) 10 MG tablet, TAKE 1/2 TO 1 (ONE-HALF TO ONE) TABLET BY MOUTH AT BEDTIME AS NEEDED FOR INSOMNIA, Disp: , Rfl:    fluticasone (FLONASE) 50 MCG/ACT nasal spray, Place into the nose., Disp: , Rfl:    Allergies as of 04/16/2023 - Review Complete 04/16/2023  Allergen Reaction Noted   Cefdinir Diarrhea 11/15/2022   Gramineae pollens  Itching 02/05/2022   Penicillins Nausea And Vomiting 11/15/2022   Sulfa antibiotics Nausea And Vomiting 10/01/2013    Past Medical History:  Diagnosis Date   Hypertension    Osteopenia     Past Surgical History:  Procedure Laterality Date   THYROIDECTOMY, PARTIAL  1990   TOTAL ABDOMINAL HYSTERECTOMY  1996    Family History  Problem Relation Age of Onset   Stroke Father    Cancer Sister    Colon cancer Neg Hx    Stomach cancer Neg Hx    Esophageal cancer Neg Hx    Rectal cancer Neg Hx    Colon polyps Neg Hx     Social History   Socioeconomic History   Marital status: Married    Spouse name: Not on file   Number of children: 1   Years of education: 14   Highest education level: Not on file  Occupational History    Comment: Copywriter, advertising   Occupation: retired  Tobacco Use   Smoking status: Never   Smokeless tobacco: Never  Vaping Use   Vaping Use: Never used  Substance and Sexual Activity   Alcohol use: No    Alcohol/week: 1.0 standard drink of alcohol    Types: 1 Glasses of wine per week   Drug use: No   Sexual activity: Not on file  Other Topics Concern   Not on file  Social History Narrative   Lives with husband    caffeine- coffee 1 cup daily   Social Determinants of Health   Financial Resource Strain: Not on file  Food Insecurity: Not on file  Transportation Needs: Not on file  Physical Activity: Not on file  Stress: Not on file  Social Connections: Not on file  Intimate Partner Violence: Not on file      Review of systems: Review of Systems  Constitutional:  Negative for unexpected weight change.  HENT:  Negative for trouble swallowing.   Gastrointestinal:  Positive for abdominal pain and constipation. Negative for abdominal distention, anal bleeding, blood Hedwig stool, diarrhea, nausea, rectal pain and vomiting.      Physical Exam: Vitals:   04/16/23 1103  BP: 90/60  Pulse: 64   Body mass index is 18.86 kg/m.  General:  well-appearing   Eyes: sclera anicteric, no redness ENT: oral mucosa moist without lesions, no cervical or supraclavicular lymphadenopathy CV: RRR, no JVD, no peripheral edema Resp: clear to auscultation bilaterally, normal RR and effort noted GI: soft, no tenderness, with active bowel sounds. No guarding or palpable organomegaly noted. Skin; warm and dry, no rash or jaundice noted Neuro: awake, alert and oriented x 3. Normal gross motor function and fluent speech   Data Reviewed:  Reviewed labs, radiology imaging, old records and pertinent past GI work up   Assessment and Plan/Recommendations:  67 year old very pleasant female with complaints of epigastric discomfort, worse postprandial Will obtain right upper quadrant ultrasound to exclude gallbladder disease or cholecystitis  GERD: Use omeprazole 40 mg daily, 30 minutes before breakfast and  add Pepcid at bedtime as needed Continue antireflux measures  Advised patient to eat small frequent meals and avoid eating within 3 to 4 hours of bedtime  If right upper quadrant ultrasound unremarkable has persistent symptoms will consider gastric emptying scan to exclude gastroparesis  Chronic idiopathic constipation: Add daily stool softener and Benefiber 1 tablespoon 2-3 times daily with meals.  Increase water intake.  Return Ludean 2 to 3 months   The patient was provided an opportunity to ask questions and all were answered. The patient agreed with the plan and demonstrated an understanding of the instructions.  Iona Beard , MD  CC: Charlane Ferretti, DO   I,Safa M Kadhim,acting as a scribe for Marsa Aris, MD.,have documented all relevant documentation on the behalf of Marsa Aris, MD,as directed by  Marsa Aris, MD while Kandee the presence of Marsa Aris, MD.   I, Marsa Aris, MD, have reviewed all documentation for this visit. The documentation on 04/16/23 for the exam, diagnosis, procedures, and orders are  all accurate and complete.

## 2023-04-16 NOTE — Patient Instructions (Addendum)
You have been scheduled for an abdominal ultrasound at [facility] (1st floor of hospital) on _______ at _______. Please arrive 30 minutes prior to your appointment for registration. Make certain not to have anything to eat or drink 6 hours prior to your appointment. Should you need to reschedule your appointment, please contact radiology at [phone]. This test typically takes about 30 minutes to perform.   We have sent the following medications to your pharmacy for you to pick up at your convenience:  Omeprazole Pepcid   Gastroesophageal Reflux Disease, Adult Gastroesophageal reflux (GER) happens when acid from the stomach flows up into the tube that connects the mouth and the stomach (esophagus). Normally, food travels down the esophagus and stays Miia the stomach to be digested. However, when a person has GER, food and stomach acid sometimes move back up into the esophagus. If this becomes a more serious problem, the person may be diagnosed with a disease called gastroesophageal reflux disease (GERD). GERD occurs when the reflux: Happens often. Causes frequent or severe symptoms. Causes problems such as damage to the esophagus. When stomach acid comes Annalei contact with the esophagus, the acid may cause inflammation Emilea the esophagus. Over time, GERD may create small holes (ulcers) Cindra the lining of the esophagus. What are the causes? This condition is caused by a problem with the muscle between the esophagus and the stomach (lower esophageal sphincter, or LES). Normally, the LES muscle closes after food passes through the esophagus to the stomach. When the LES is weakened or abnormal, it does not close properly, and that allows food and stomach acid to go back up into the esophagus. The LES can be weakened by certain dietary substances, medicines, and medical conditions, including: Tobacco use. Pregnancy. Having a hiatal hernia. Alcohol use. Certain foods and beverages, such as coffee,  chocolate, onions, and peppermint. What increases the risk? You are more likely to develop this condition if you: Have an increased body weight. Have a connective tissue disorder. Take NSAIDs, such as ibuprofen. What are the signs or symptoms? Symptoms of this condition include: Heartburn. Difficult or painful swallowing and the feeling of having a lump Tytianna the throat. A bitter taste Aeon the mouth. Bad breath and having a large amount of saliva. Having an upset or bloated stomach and belching. Chest pain. Different conditions can cause chest pain. Make sure you see your health care provider if you experience chest pain. Shortness of breath or wheezing. Ongoing (chronic) cough or a nighttime cough. Wearing away of tooth enamel. Weight loss. How is this diagnosed? This condition may be diagnosed based on a medical history and a physical exam. To determine if you have mild or severe GERD, your health care provider may also monitor how you respond to treatment. You may also have tests, including: A test to examine your stomach and esophagus with a small camera (endoscopy). A test that measures the acidity level Prarthana your esophagus. A test that measures how much pressure is on your esophagus. A barium swallow or modified barium swallow test to show the shape, size, and functioning of your esophagus. How is this treated? Treatment for this condition may vary depending on how severe your symptoms are. Your health care provider may recommend: Changes to your diet. Medicine. Surgery. The goal of treatment is to help relieve your symptoms and to prevent complications. Follow these instructions at home: Eating and drinking  Follow a diet as recommended by your health care provider. This may involve avoiding  foods and drinks such as: Coffee and tea, with or without caffeine. Drinks that contain alcohol. Energy drinks and sports drinks. Carbonated drinks or sodas. Chocolate and  cocoa. Peppermint and mint flavorings. Garlic and onions. Horseradish. Spicy and acidic foods, including peppers, chili powder, curry powder, vinegar, hot sauces, and barbecue sauce. Citrus fruit juices and citrus fruits, such as oranges, lemons, and limes. Tomato-based foods, such as red sauce, chili, salsa, and pizza with red sauce. Fried and fatty foods, such as donuts, french fries, potato chips, and high-fat dressings. High-fat meats, such as hot dogs and fatty cuts of red and white meats, such as rib eye steak, sausage, ham, and bacon. High-fat dairy items, such as whole milk, butter, and cream cheese. Eat small, frequent meals instead of large meals. Avoid drinking large amounts of liquid with your meals. Avoid eating meals during the 2-3 hours before bedtime. Avoid lying down right after you eat. Do not exercise right after you eat. Lifestyle  Do not use any products that contain nicotine or tobacco. These products include cigarettes, chewing tobacco, and vaping devices, such as e-cigarettes. If you need help quitting, ask your health care provider. Try to reduce your stress by using methods such as yoga or meditation. If you need help reducing stress, ask your health care provider. If you are overweight, reduce your weight to an amount that is healthy for you. Ask your health care provider for guidance about a safe weight loss goal. General instructions Pay attention to any changes Zophia your symptoms. Take over-the-counter and prescription medicines only as told by your health care provider. Do not take aspirin, ibuprofen, or other NSAIDs unless your health care provider told you to take these medicines. Wear loose-fitting clothing. Do not wear anything tight around your waist that causes pressure on your abdomen. Raise (elevate) the head of your bed about 6 inches (15 cm). You can use a wedge to do this. Avoid bending over if this makes your symptoms worse. Keep all follow-up  visits. This is important. Contact a health care provider if: You have: New symptoms. Unexplained weight loss. Difficulty swallowing or it hurts to swallow. Wheezing or a persistent cough. A hoarse voice. Your symptoms do not improve with treatment. Get help right away if: You have sudden pain Nike your arms, neck, jaw, teeth, or back. You suddenly feel sweaty, dizzy, or light-headed. You have chest pain or shortness of breath. You vomit and the vomit is green, yellow, or black, or it looks like blood or coffee grounds. You faint. You have stool that is red, bloody, or black. You cannot swallow, drink, or eat. These symptoms may represent a serious problem that is an emergency. Do not wait to see if the symptoms will go away. Get medical help right away. Call your local emergency services (911 Jamile[place] the [place] Do not drive yourself to the hospital. Summary Gastroesophageal reflux happens when acid from the stomach flows up into the esophagus. GERD is a disease Bevan which the reflux happens often, causes frequent or severe symptoms, or causes problems such as damage to the esophagus. Treatment for this condition may vary depending on how severe your symptoms are. Your health care provider may recommend diet and lifestyle changes, medicine, or surgery. Contact a health care provider if you have new or worsening symptoms. Take over-the-counter and prescription medicines only as told by your health care provider. Do not take aspirin, ibuprofen, or other NSAIDs unless your health care provider told you to do so.  Keep all follow-up visits as told by your health care provider. This is important. This information is not intended to replace advice given to you by your health care provider. Make sure you discuss any questions you have with your health care provider. Document Revised: 05/15/2020 Document Reviewed: 05/15/2020 Elsevier Patient Education  2024 [employer].  Due to recent changes Pe[place]  healthcare laws, you may see the results of your imaging and laboratory studies on MyChart before your provider has had a chance to review them.  We understand that Elli some cases there may be results that are confusing or concerning to you. Not all laboratory results come back Fusako the same time frame and the provider may be waiting for multiple results Daleisa order to interpret others.  Please give [place] 48 hours Ruchama order for your provider to thoroughly review all the results before contacting the office for clarification of your results.     I appreciate the  opportunity to care for you  Thank You   Marsa Aris , MD

## 2023-04-17 ENCOUNTER — Inpatient Hospital Stay: Admission: RE | Admit: 2023-04-17 | Payer: Medicare Other | Source: Ambulatory Visit

## 2023-04-17 DIAGNOSIS — J301 Allergic rhinitis due to pollen: Secondary | ICD-10-CM | POA: Diagnosis not present

## 2023-04-17 DIAGNOSIS — J3089 Other allergic rhinitis: Secondary | ICD-10-CM | POA: Diagnosis not present

## 2023-04-22 ENCOUNTER — Ambulatory Visit (HOSPITAL_COMMUNITY)
Admission: RE | Admit: 2023-04-22 | Discharge: 2023-04-22 | Disposition: A | Discharge status: Home / Self Care | Payer: Medicare Other | Source: Ambulatory Visit | Attending: Gastroenterology | Admitting: Gastroenterology

## 2023-04-22 DIAGNOSIS — R1011 Right upper quadrant pain: Secondary | ICD-10-CM | POA: Insufficient documentation

## 2023-04-22 DIAGNOSIS — R1013 Epigastric pain: Secondary | ICD-10-CM | POA: Insufficient documentation

## 2023-04-22 DIAGNOSIS — K219 Gastro-esophageal reflux disease without esophagitis: Secondary | ICD-10-CM | POA: Diagnosis not present

## 2023-04-24 DIAGNOSIS — J301 Allergic rhinitis due to pollen: Secondary | ICD-10-CM | POA: Diagnosis not present

## 2023-04-24 DIAGNOSIS — J3089 Other allergic rhinitis: Secondary | ICD-10-CM | POA: Diagnosis not present

## 2023-04-25 ENCOUNTER — Encounter: Payer: Self-pay | Admitting: Gastroenterology

## 2023-05-01 DIAGNOSIS — J3089 Other allergic rhinitis: Secondary | ICD-10-CM | POA: Diagnosis not present

## 2023-05-01 DIAGNOSIS — J301 Allergic rhinitis due to pollen: Secondary | ICD-10-CM | POA: Diagnosis not present

## 2023-05-01 DIAGNOSIS — J3081 Allergic rhinitis due to animal (cat) (dog) hair and dander: Secondary | ICD-10-CM | POA: Diagnosis not present

## 2023-05-06 DIAGNOSIS — K080 Exfoliation of teeth due to systemic causes: Secondary | ICD-10-CM | POA: Diagnosis not present

## 2023-05-08 DIAGNOSIS — J3081 Allergic rhinitis due to animal (cat) (dog) hair and dander: Secondary | ICD-10-CM | POA: Diagnosis not present

## 2023-05-08 DIAGNOSIS — J301 Allergic rhinitis due to pollen: Secondary | ICD-10-CM | POA: Diagnosis not present

## 2023-05-08 DIAGNOSIS — J3089 Other allergic rhinitis: Secondary | ICD-10-CM | POA: Diagnosis not present

## 2023-05-15 DIAGNOSIS — J3089 Other allergic rhinitis: Secondary | ICD-10-CM | POA: Diagnosis not present

## 2023-05-15 DIAGNOSIS — J3081 Allergic rhinitis due to animal (cat) (dog) hair and dander: Secondary | ICD-10-CM | POA: Diagnosis not present

## 2023-05-15 DIAGNOSIS — J301 Allergic rhinitis due to pollen: Secondary | ICD-10-CM | POA: Diagnosis not present

## 2023-05-21 DIAGNOSIS — J301 Allergic rhinitis due to pollen: Secondary | ICD-10-CM | POA: Diagnosis not present

## 2023-05-21 DIAGNOSIS — J3081 Allergic rhinitis due to animal (cat) (dog) hair and dander: Secondary | ICD-10-CM | POA: Diagnosis not present

## 2023-05-21 DIAGNOSIS — J3089 Other allergic rhinitis: Secondary | ICD-10-CM | POA: Diagnosis not present

## 2023-05-29 DIAGNOSIS — J343 Hypertrophy of nasal turbinates: Secondary | ICD-10-CM | POA: Diagnosis not present

## 2023-05-29 DIAGNOSIS — J301 Allergic rhinitis due to pollen: Secondary | ICD-10-CM | POA: Diagnosis not present

## 2023-05-29 DIAGNOSIS — J3089 Other allergic rhinitis: Secondary | ICD-10-CM | POA: Diagnosis not present

## 2023-05-29 DIAGNOSIS — J310 Chronic rhinitis: Secondary | ICD-10-CM | POA: Diagnosis not present

## 2023-05-29 DIAGNOSIS — J3081 Allergic rhinitis due to animal (cat) (dog) hair and dander: Secondary | ICD-10-CM | POA: Diagnosis not present

## 2023-05-29 DIAGNOSIS — K2100 Gastro-esophageal reflux disease with esophagitis, without bleeding: Secondary | ICD-10-CM | POA: Diagnosis not present

## 2023-05-29 DIAGNOSIS — J342 Deviated nasal septum: Secondary | ICD-10-CM | POA: Diagnosis not present

## 2023-06-03 DIAGNOSIS — E785 Hyperlipidemia, unspecified: Secondary | ICD-10-CM | POA: Diagnosis not present

## 2023-06-03 DIAGNOSIS — M810 Age-related osteoporosis without current pathological fracture: Secondary | ICD-10-CM | POA: Diagnosis not present

## 2023-06-03 DIAGNOSIS — R7989 Other specified abnormal findings of blood chemistry: Secondary | ICD-10-CM | POA: Diagnosis not present

## 2023-06-03 DIAGNOSIS — E7849 Other hyperlipidemia: Secondary | ICD-10-CM | POA: Diagnosis not present

## 2023-06-03 DIAGNOSIS — Z1389 Encounter for screening for other disorder: Secondary | ICD-10-CM | POA: Diagnosis not present

## 2023-06-03 DIAGNOSIS — Z0000 Encounter for general adult medical examination without abnormal findings: Secondary | ICD-10-CM | POA: Diagnosis not present

## 2023-06-03 DIAGNOSIS — R531 Weakness: Secondary | ICD-10-CM | POA: Diagnosis not present

## 2023-06-05 DIAGNOSIS — J3089 Other allergic rhinitis: Secondary | ICD-10-CM | POA: Diagnosis not present

## 2023-06-05 DIAGNOSIS — J3081 Allergic rhinitis due to animal (cat) (dog) hair and dander: Secondary | ICD-10-CM | POA: Diagnosis not present

## 2023-06-05 DIAGNOSIS — J301 Allergic rhinitis due to pollen: Secondary | ICD-10-CM | POA: Diagnosis not present

## 2023-06-10 DIAGNOSIS — R82998 Other abnormal findings in urine: Secondary | ICD-10-CM | POA: Diagnosis not present

## 2023-06-10 DIAGNOSIS — Z23 Encounter for immunization: Secondary | ICD-10-CM | POA: Diagnosis not present

## 2023-06-10 DIAGNOSIS — Z0000 Encounter for general adult medical examination without abnormal findings: Secondary | ICD-10-CM | POA: Diagnosis not present

## 2023-06-10 DIAGNOSIS — I700 Atherosclerosis of aorta: Secondary | ICD-10-CM | POA: Diagnosis not present

## 2023-06-10 DIAGNOSIS — I10 Essential (primary) hypertension: Secondary | ICD-10-CM | POA: Diagnosis not present

## 2023-06-16 DIAGNOSIS — J3089 Other allergic rhinitis: Secondary | ICD-10-CM | POA: Diagnosis not present

## 2023-06-16 DIAGNOSIS — J3081 Allergic rhinitis due to animal (cat) (dog) hair and dander: Secondary | ICD-10-CM | POA: Diagnosis not present

## 2023-06-16 DIAGNOSIS — J301 Allergic rhinitis due to pollen: Secondary | ICD-10-CM | POA: Diagnosis not present

## 2023-06-23 DIAGNOSIS — J301 Allergic rhinitis due to pollen: Secondary | ICD-10-CM | POA: Diagnosis not present

## 2023-06-23 DIAGNOSIS — J3081 Allergic rhinitis due to animal (cat) (dog) hair and dander: Secondary | ICD-10-CM | POA: Diagnosis not present

## 2023-06-23 DIAGNOSIS — J3089 Other allergic rhinitis: Secondary | ICD-10-CM | POA: Diagnosis not present

## 2023-06-30 DIAGNOSIS — J301 Allergic rhinitis due to pollen: Secondary | ICD-10-CM | POA: Diagnosis not present

## 2023-06-30 DIAGNOSIS — J3089 Other allergic rhinitis: Secondary | ICD-10-CM | POA: Diagnosis not present

## 2023-06-30 DIAGNOSIS — J3081 Allergic rhinitis due to animal (cat) (dog) hair and dander: Secondary | ICD-10-CM | POA: Diagnosis not present

## 2023-07-08 DIAGNOSIS — J3089 Other allergic rhinitis: Secondary | ICD-10-CM | POA: Diagnosis not present

## 2023-07-08 DIAGNOSIS — J3081 Allergic rhinitis due to animal (cat) (dog) hair and dander: Secondary | ICD-10-CM | POA: Diagnosis not present

## 2023-07-08 DIAGNOSIS — J301 Allergic rhinitis due to pollen: Secondary | ICD-10-CM | POA: Diagnosis not present

## 2023-07-11 DIAGNOSIS — Z0000 Encounter for general adult medical examination without abnormal findings: Secondary | ICD-10-CM | POA: Diagnosis not present

## 2023-07-14 DIAGNOSIS — J3081 Allergic rhinitis due to animal (cat) (dog) hair and dander: Secondary | ICD-10-CM | POA: Diagnosis not present

## 2023-07-14 DIAGNOSIS — J3089 Other allergic rhinitis: Secondary | ICD-10-CM | POA: Diagnosis not present

## 2023-07-14 DIAGNOSIS — J301 Allergic rhinitis due to pollen: Secondary | ICD-10-CM | POA: Diagnosis not present

## 2023-07-17 DIAGNOSIS — H1045 Other chronic allergic conjunctivitis: Secondary | ICD-10-CM | POA: Diagnosis not present

## 2023-07-17 DIAGNOSIS — J3089 Other allergic rhinitis: Secondary | ICD-10-CM | POA: Diagnosis not present

## 2023-07-17 DIAGNOSIS — T565X4A Toxic effect of zinc and its compounds, undetermined, initial encounter: Secondary | ICD-10-CM | POA: Diagnosis not present

## 2023-07-17 DIAGNOSIS — J301 Allergic rhinitis due to pollen: Secondary | ICD-10-CM | POA: Diagnosis not present

## 2023-07-18 DIAGNOSIS — H401131 Primary open-angle glaucoma, bilateral, mild stage: Secondary | ICD-10-CM | POA: Diagnosis not present

## 2023-07-18 DIAGNOSIS — H353131 Nonexudative age-related macular degeneration, bilateral, early dry stage: Secondary | ICD-10-CM | POA: Diagnosis not present

## 2023-07-22 DIAGNOSIS — J3081 Allergic rhinitis due to animal (cat) (dog) hair and dander: Secondary | ICD-10-CM | POA: Diagnosis not present

## 2023-07-22 DIAGNOSIS — J3089 Other allergic rhinitis: Secondary | ICD-10-CM | POA: Diagnosis not present

## 2023-07-22 DIAGNOSIS — J301 Allergic rhinitis due to pollen: Secondary | ICD-10-CM | POA: Diagnosis not present

## 2023-07-28 DIAGNOSIS — J3081 Allergic rhinitis due to animal (cat) (dog) hair and dander: Secondary | ICD-10-CM | POA: Diagnosis not present

## 2023-07-28 DIAGNOSIS — J301 Allergic rhinitis due to pollen: Secondary | ICD-10-CM | POA: Diagnosis not present

## 2023-07-28 DIAGNOSIS — J3089 Other allergic rhinitis: Secondary | ICD-10-CM | POA: Diagnosis not present

## 2023-08-04 DIAGNOSIS — J3081 Allergic rhinitis due to animal (cat) (dog) hair and dander: Secondary | ICD-10-CM | POA: Diagnosis not present

## 2023-08-04 DIAGNOSIS — J301 Allergic rhinitis due to pollen: Secondary | ICD-10-CM | POA: Diagnosis not present

## 2023-08-04 DIAGNOSIS — J3089 Other allergic rhinitis: Secondary | ICD-10-CM | POA: Diagnosis not present

## 2023-08-05 DIAGNOSIS — H938X3 Other specified disorders of ear, bilateral: Secondary | ICD-10-CM | POA: Diagnosis not present

## 2023-08-05 DIAGNOSIS — J302 Other seasonal allergic rhinitis: Secondary | ICD-10-CM | POA: Diagnosis not present

## 2023-08-11 DIAGNOSIS — J3081 Allergic rhinitis due to animal (cat) (dog) hair and dander: Secondary | ICD-10-CM | POA: Diagnosis not present

## 2023-08-11 DIAGNOSIS — J3089 Other allergic rhinitis: Secondary | ICD-10-CM | POA: Diagnosis not present

## 2023-08-11 DIAGNOSIS — J301 Allergic rhinitis due to pollen: Secondary | ICD-10-CM | POA: Diagnosis not present

## 2023-08-18 ENCOUNTER — Ambulatory Visit: Payer: Medicare Other | Admitting: Gastroenterology

## 2023-08-18 ENCOUNTER — Encounter: Payer: Self-pay | Admitting: Gastroenterology

## 2023-08-18 VITALS — BP 134/60 | HR 64 | Ht 60.5 in | Wt 97.2 lb

## 2023-08-18 DIAGNOSIS — K219 Gastro-esophageal reflux disease without esophagitis: Secondary | ICD-10-CM

## 2023-08-18 DIAGNOSIS — R110 Nausea: Secondary | ICD-10-CM

## 2023-08-18 DIAGNOSIS — R1310 Dysphagia, unspecified: Secondary | ICD-10-CM | POA: Diagnosis not present

## 2023-08-18 DIAGNOSIS — K5904 Chronic idiopathic constipation: Secondary | ICD-10-CM | POA: Diagnosis not present

## 2023-08-18 NOTE — Patient Instructions (Addendum)
You have been scheduled for a gastric emptying scan at [facility] on 08/26/2023 arrive at 7:45am. Please arrive at least 30 minutes prior to your appointment for registration. Please make certain not to have anything to eat or drink after midnight the night before your test. Hold all stomach medications (ex: Zofran, phenergan, Reglan) 24 hours prior to your test. If you need to reschedule your appointment, please contact radiology scheduling at [phone]. _____________________________________________________________________ A gastric-emptying study measures how long it takes for food to move through your stomach. There are several ways to measure stomach emptying. Vega the most common test, you eat food that contains a small amount of radioactive material. A scanner that detects the movement of the radioactive material is placed over your abdomen to monitor the rate at which food leaves your stomach. This test normally takes about 4 hours to complete. _____________________________________________________________________   Continue Pantoprazole   Use B-Complex daily x 1 month  Use Colace daily at bedtime  Use FDgard and IBgard three times a day as needed  Due to recent changes [place] healthcare laws, you may see the results of your imaging and laboratory studies on MyChart before your provider has had a chance to review them.  We understand that Shavonn some cases there may be results that are confusing or concerning to you. Not all laboratory results come back Evelene the same time frame and the provider may be waiting for multiple results Kayani order to interpret others.  Please give [place] 48 hours Vitalia order for your provider to thoroughly review all the results before contacting the office for clarification of your results.   I appreciate the  opportunity to care for you  Thank You   Marsa Aris , MD

## 2023-08-19 DIAGNOSIS — J3081 Allergic rhinitis due to animal (cat) (dog) hair and dander: Secondary | ICD-10-CM | POA: Diagnosis not present

## 2023-08-19 DIAGNOSIS — J3089 Other allergic rhinitis: Secondary | ICD-10-CM | POA: Diagnosis not present

## 2023-08-19 DIAGNOSIS — J301 Allergic rhinitis due to pollen: Secondary | ICD-10-CM | POA: Diagnosis not present

## 2023-08-22 DIAGNOSIS — H401131 Primary open-angle glaucoma, bilateral, mild stage: Secondary | ICD-10-CM | POA: Diagnosis not present

## 2023-08-22 DIAGNOSIS — H353131 Nonexudative age-related macular degeneration, bilateral, early dry stage: Secondary | ICD-10-CM | POA: Diagnosis not present

## 2023-08-25 DIAGNOSIS — J3081 Allergic rhinitis due to animal (cat) (dog) hair and dander: Secondary | ICD-10-CM | POA: Diagnosis not present

## 2023-08-25 DIAGNOSIS — J301 Allergic rhinitis due to pollen: Secondary | ICD-10-CM | POA: Diagnosis not present

## 2023-08-25 DIAGNOSIS — J3089 Other allergic rhinitis: Secondary | ICD-10-CM | POA: Diagnosis not present

## 2023-08-26 ENCOUNTER — Ambulatory Visit (HOSPITAL_COMMUNITY)
Admission: RE | Admit: 2023-08-26 | Discharge: 2023-08-26 | Disposition: A | Discharge status: Home / Self Care | Payer: Medicare Other | Source: Ambulatory Visit | Attending: Gastroenterology | Admitting: Gastroenterology

## 2023-08-26 DIAGNOSIS — R1310 Dysphagia, unspecified: Secondary | ICD-10-CM | POA: Diagnosis not present

## 2023-08-26 DIAGNOSIS — K219 Gastro-esophageal reflux disease without esophagitis: Secondary | ICD-10-CM | POA: Diagnosis not present

## 2023-08-26 DIAGNOSIS — K5904 Chronic idiopathic constipation: Secondary | ICD-10-CM | POA: Diagnosis not present

## 2023-08-26 DIAGNOSIS — R110 Nausea: Secondary | ICD-10-CM | POA: Insufficient documentation

## 2023-08-26 MED ORDER — TECHNETIUM TC 99M SULFUR COLLOID
2.0000 | Freq: Once | INTRAVENOUS | Status: AC
  Administered 2023-08-26: 2.18 via ORAL

## 2023-09-01 DIAGNOSIS — J0101 Acute recurrent maxillary sinusitis: Secondary | ICD-10-CM | POA: Diagnosis not present

## 2023-09-08 DIAGNOSIS — J3089 Other allergic rhinitis: Secondary | ICD-10-CM | POA: Diagnosis not present

## 2023-09-08 DIAGNOSIS — J301 Allergic rhinitis due to pollen: Secondary | ICD-10-CM | POA: Diagnosis not present

## 2023-09-08 DIAGNOSIS — J3081 Allergic rhinitis due to animal (cat) (dog) hair and dander: Secondary | ICD-10-CM | POA: Diagnosis not present

## 2023-09-11 DIAGNOSIS — R790 Abnormal level of blood mineral: Secondary | ICD-10-CM | POA: Diagnosis not present

## 2023-09-11 DIAGNOSIS — Z1231 Encounter for screening mammogram for malignant neoplasm of breast: Secondary | ICD-10-CM | POA: Diagnosis not present

## 2023-09-11 DIAGNOSIS — Z01419 Encounter for gynecological examination (general) (routine) without abnormal findings: Secondary | ICD-10-CM | POA: Diagnosis not present

## 2023-09-11 DIAGNOSIS — E079 Disorder of thyroid, unspecified: Secondary | ICD-10-CM | POA: Diagnosis not present

## 2023-09-15 ENCOUNTER — Telehealth: Payer: Self-pay | Admitting: Gastroenterology

## 2023-09-15 DIAGNOSIS — J3089 Other allergic rhinitis: Secondary | ICD-10-CM | POA: Diagnosis not present

## 2023-09-15 DIAGNOSIS — J301 Allergic rhinitis due to pollen: Secondary | ICD-10-CM | POA: Diagnosis not present

## 2023-09-15 DIAGNOSIS — J3081 Allergic rhinitis due to animal (cat) (dog) hair and dander: Secondary | ICD-10-CM | POA: Diagnosis not present

## 2023-09-15 NOTE — Telephone Encounter (Signed)
Inbound call from patient stating she is still having a hard time digesting food. Patient states she has been losing weight due to not eating. States she sometimes has diarrhea. Patient requesting a call back to discuss further. Please advise, thank you.

## 2023-09-16 NOTE — Telephone Encounter (Signed)
Spoke with patient. She reports continued issues with feeling that she cannot move her bowels. She has one bowel movement a day. She then feels like she needs to have another bowel movement later Katrina Robertson the day but cannot. She tells me this will happen after she eats. She also reports that she "cannot eat what I used to" such as tomato based foods or fried foods. Patient says she eats very little and is not sure what to eat.  Patient last seen 08/18/23. Reviewed with the patient the recommendations given at that visit. Patient has not been following the recommended bowel regimen of daily stool softener and benefiber. She agrees to try daily Colace and Benefiber 1 tablespoon 2 to 3 times a day.  Agreed with the patient that staying away from foods that caused her GI distress such as fried or tomato based foods is a good plan. Broaden her diet slowly to find what she can tolerate. Eat small meals. Add healthy snacks or protein drinks to address her concerns of weight loss.  Call back if she fails to improve or she acutely worsens.

## 2023-09-17 NOTE — Telephone Encounter (Signed)
Agree, thank you

## 2023-09-22 DIAGNOSIS — H401131 Primary open-angle glaucoma, bilateral, mild stage: Secondary | ICD-10-CM | POA: Diagnosis not present

## 2023-09-22 DIAGNOSIS — H04123 Dry eye syndrome of bilateral lacrimal glands: Secondary | ICD-10-CM | POA: Diagnosis not present

## 2023-09-23 ENCOUNTER — Ambulatory Visit: Payer: Medicare Other | Admitting: Gastroenterology

## 2023-09-23 DIAGNOSIS — J3089 Other allergic rhinitis: Secondary | ICD-10-CM | POA: Diagnosis not present

## 2023-09-23 DIAGNOSIS — J3081 Allergic rhinitis due to animal (cat) (dog) hair and dander: Secondary | ICD-10-CM | POA: Diagnosis not present

## 2023-09-23 DIAGNOSIS — J301 Allergic rhinitis due to pollen: Secondary | ICD-10-CM | POA: Diagnosis not present

## 2023-09-24 DIAGNOSIS — J343 Hypertrophy of nasal turbinates: Secondary | ICD-10-CM | POA: Diagnosis not present

## 2023-09-24 DIAGNOSIS — J309 Allergic rhinitis, unspecified: Secondary | ICD-10-CM | POA: Diagnosis not present

## 2023-09-24 DIAGNOSIS — K219 Gastro-esophageal reflux disease without esophagitis: Secondary | ICD-10-CM | POA: Diagnosis not present

## 2023-09-24 DIAGNOSIS — Z8709 Personal history of other diseases of the respiratory system: Secondary | ICD-10-CM | POA: Diagnosis not present

## 2023-09-24 DIAGNOSIS — R448 Other symptoms and signs involving general sensations and perceptions: Secondary | ICD-10-CM | POA: Diagnosis not present

## 2023-09-29 DIAGNOSIS — J3081 Allergic rhinitis due to animal (cat) (dog) hair and dander: Secondary | ICD-10-CM | POA: Diagnosis not present

## 2023-09-29 DIAGNOSIS — J301 Allergic rhinitis due to pollen: Secondary | ICD-10-CM | POA: Diagnosis not present

## 2023-09-29 DIAGNOSIS — J3089 Other allergic rhinitis: Secondary | ICD-10-CM | POA: Diagnosis not present

## 2023-10-03 DIAGNOSIS — R519 Headache, unspecified: Secondary | ICD-10-CM | POA: Diagnosis not present

## 2023-10-03 DIAGNOSIS — J342 Deviated nasal septum: Secondary | ICD-10-CM | POA: Diagnosis not present

## 2023-10-03 DIAGNOSIS — J329 Chronic sinusitis, unspecified: Secondary | ICD-10-CM | POA: Diagnosis not present

## 2023-10-03 DIAGNOSIS — H9311 Tinnitus, right ear: Secondary | ICD-10-CM | POA: Diagnosis not present

## 2023-10-06 DIAGNOSIS — M810 Age-related osteoporosis without current pathological fracture: Secondary | ICD-10-CM | POA: Diagnosis not present

## 2023-10-10 DIAGNOSIS — J301 Allergic rhinitis due to pollen: Secondary | ICD-10-CM | POA: Diagnosis not present

## 2023-10-10 DIAGNOSIS — J3089 Other allergic rhinitis: Secondary | ICD-10-CM | POA: Diagnosis not present

## 2023-10-10 DIAGNOSIS — J3081 Allergic rhinitis due to animal (cat) (dog) hair and dander: Secondary | ICD-10-CM | POA: Diagnosis not present

## 2023-10-20 DIAGNOSIS — J3081 Allergic rhinitis due to animal (cat) (dog) hair and dander: Secondary | ICD-10-CM | POA: Diagnosis not present

## 2023-10-20 DIAGNOSIS — J301 Allergic rhinitis due to pollen: Secondary | ICD-10-CM | POA: Diagnosis not present

## 2023-10-20 DIAGNOSIS — J3089 Other allergic rhinitis: Secondary | ICD-10-CM | POA: Diagnosis not present

## 2023-10-27 DIAGNOSIS — J3089 Other allergic rhinitis: Secondary | ICD-10-CM | POA: Diagnosis not present

## 2023-10-27 DIAGNOSIS — J301 Allergic rhinitis due to pollen: Secondary | ICD-10-CM | POA: Diagnosis not present

## 2023-10-27 DIAGNOSIS — J3081 Allergic rhinitis due to animal (cat) (dog) hair and dander: Secondary | ICD-10-CM | POA: Diagnosis not present

## 2023-11-03 DIAGNOSIS — J301 Allergic rhinitis due to pollen: Secondary | ICD-10-CM | POA: Diagnosis not present

## 2023-11-03 DIAGNOSIS — J3081 Allergic rhinitis due to animal (cat) (dog) hair and dander: Secondary | ICD-10-CM | POA: Diagnosis not present

## 2023-11-03 DIAGNOSIS — J3089 Other allergic rhinitis: Secondary | ICD-10-CM | POA: Diagnosis not present

## 2023-11-07 DIAGNOSIS — J301 Allergic rhinitis due to pollen: Secondary | ICD-10-CM | POA: Diagnosis not present

## 2023-11-07 DIAGNOSIS — J3089 Other allergic rhinitis: Secondary | ICD-10-CM | POA: Diagnosis not present

## 2023-11-10 DIAGNOSIS — J3089 Other allergic rhinitis: Secondary | ICD-10-CM | POA: Diagnosis not present

## 2023-11-10 DIAGNOSIS — J301 Allergic rhinitis due to pollen: Secondary | ICD-10-CM | POA: Diagnosis not present

## 2023-11-17 DIAGNOSIS — J301 Allergic rhinitis due to pollen: Secondary | ICD-10-CM | POA: Diagnosis not present

## 2023-11-17 DIAGNOSIS — J3089 Other allergic rhinitis: Secondary | ICD-10-CM | POA: Diagnosis not present

## 2023-11-17 DIAGNOSIS — J3081 Allergic rhinitis due to animal (cat) (dog) hair and dander: Secondary | ICD-10-CM | POA: Diagnosis not present

## 2023-11-24 DIAGNOSIS — J3089 Other allergic rhinitis: Secondary | ICD-10-CM | POA: Diagnosis not present

## 2023-11-24 DIAGNOSIS — J301 Allergic rhinitis due to pollen: Secondary | ICD-10-CM | POA: Diagnosis not present

## 2023-11-24 DIAGNOSIS — J3081 Allergic rhinitis due to animal (cat) (dog) hair and dander: Secondary | ICD-10-CM | POA: Diagnosis not present

## 2023-12-02 DIAGNOSIS — J3089 Other allergic rhinitis: Secondary | ICD-10-CM | POA: Diagnosis not present

## 2023-12-02 DIAGNOSIS — J301 Allergic rhinitis due to pollen: Secondary | ICD-10-CM | POA: Diagnosis not present

## 2023-12-09 DIAGNOSIS — J3089 Other allergic rhinitis: Secondary | ICD-10-CM | POA: Diagnosis not present

## 2023-12-09 DIAGNOSIS — J301 Allergic rhinitis due to pollen: Secondary | ICD-10-CM | POA: Diagnosis not present

## 2023-12-11 DIAGNOSIS — I700 Atherosclerosis of aorta: Secondary | ICD-10-CM | POA: Diagnosis not present

## 2023-12-16 DIAGNOSIS — J301 Allergic rhinitis due to pollen: Secondary | ICD-10-CM | POA: Diagnosis not present

## 2023-12-16 DIAGNOSIS — J3089 Other allergic rhinitis: Secondary | ICD-10-CM | POA: Diagnosis not present

## 2023-12-16 DIAGNOSIS — J3081 Allergic rhinitis due to animal (cat) (dog) hair and dander: Secondary | ICD-10-CM | POA: Diagnosis not present

## 2023-12-23 DIAGNOSIS — J3089 Other allergic rhinitis: Secondary | ICD-10-CM | POA: Diagnosis not present

## 2023-12-23 DIAGNOSIS — J3081 Allergic rhinitis due to animal (cat) (dog) hair and dander: Secondary | ICD-10-CM | POA: Diagnosis not present

## 2023-12-23 DIAGNOSIS — J301 Allergic rhinitis due to pollen: Secondary | ICD-10-CM | POA: Diagnosis not present

## 2023-12-29 DIAGNOSIS — J3081 Allergic rhinitis due to animal (cat) (dog) hair and dander: Secondary | ICD-10-CM | POA: Diagnosis not present

## 2023-12-29 DIAGNOSIS — J301 Allergic rhinitis due to pollen: Secondary | ICD-10-CM | POA: Diagnosis not present

## 2023-12-29 DIAGNOSIS — J3089 Other allergic rhinitis: Secondary | ICD-10-CM | POA: Diagnosis not present

## 2023-12-31 DIAGNOSIS — I10 Essential (primary) hypertension: Secondary | ICD-10-CM | POA: Diagnosis not present

## 2023-12-31 DIAGNOSIS — G5702 Lesion of sciatic nerve, left lower limb: Secondary | ICD-10-CM | POA: Diagnosis not present

## 2023-12-31 DIAGNOSIS — M9904 Segmental and somatic dysfunction of sacral region: Secondary | ICD-10-CM | POA: Diagnosis not present

## 2024-01-06 DIAGNOSIS — J3081 Allergic rhinitis due to animal (cat) (dog) hair and dander: Secondary | ICD-10-CM | POA: Diagnosis not present

## 2024-01-06 DIAGNOSIS — J3089 Other allergic rhinitis: Secondary | ICD-10-CM | POA: Diagnosis not present

## 2024-01-06 DIAGNOSIS — J301 Allergic rhinitis due to pollen: Secondary | ICD-10-CM | POA: Diagnosis not present

## 2024-01-13 ENCOUNTER — Telehealth: Payer: Self-pay | Admitting: Gastroenterology

## 2024-01-13 NOTE — Telephone Encounter (Signed)
 Cannot exclude possible gastroenteritis, please advise patient to maintain adequate hydration, call with any worsening symptoms, may need to check GI path panel if persistent. Agree with recommendations. Thanks

## 2024-01-13 NOTE — Telephone Encounter (Signed)
 Inbound call from patient states that she feels like she always has to use the bathroom. Patient states she has also been passing a lot of gas and is not able to eat food that is greasy. Patient is requesting a call to discuss. Please advise

## 2024-01-13 NOTE — Telephone Encounter (Signed)
 Spoke with the patient. She ate at a restaurant yesterday. She ordered eggs and toast. She has an upset stomach afterwards and evacuated her bowels 3 times. She reports she can eat eggs at home without any problems. She says she prepares her foods using very little oils. Today she is better. She notes that she sometimes feels she needs to move her bowels and cannot. She sometimes will have loose stools. We discussed IBS symptoms. She tried IBguard Phil the past but felt a "cool sensation" and felt she might be allergic to it. We discussed that it is peppermint oil and can possibly create a "cool" sensation. She did not develop a rash or nausea/vomiting. She is willing to try it again. Avoid rich , greasy or spicy foods when eating out and at home. Follow up appointment scheduled.

## 2024-01-14 DIAGNOSIS — J3089 Other allergic rhinitis: Secondary | ICD-10-CM | POA: Diagnosis not present

## 2024-01-14 DIAGNOSIS — J301 Allergic rhinitis due to pollen: Secondary | ICD-10-CM | POA: Diagnosis not present

## 2024-01-14 DIAGNOSIS — J3081 Allergic rhinitis due to animal (cat) (dog) hair and dander: Secondary | ICD-10-CM | POA: Diagnosis not present

## 2024-01-19 DIAGNOSIS — J3089 Other allergic rhinitis: Secondary | ICD-10-CM | POA: Diagnosis not present

## 2024-01-19 DIAGNOSIS — J301 Allergic rhinitis due to pollen: Secondary | ICD-10-CM | POA: Diagnosis not present

## 2024-01-19 DIAGNOSIS — J3081 Allergic rhinitis due to animal (cat) (dog) hair and dander: Secondary | ICD-10-CM | POA: Diagnosis not present

## 2024-01-27 DIAGNOSIS — R1030 Lower abdominal pain, unspecified: Secondary | ICD-10-CM | POA: Diagnosis not present

## 2024-01-27 DIAGNOSIS — J3081 Allergic rhinitis due to animal (cat) (dog) hair and dander: Secondary | ICD-10-CM | POA: Diagnosis not present

## 2024-01-27 DIAGNOSIS — J3089 Other allergic rhinitis: Secondary | ICD-10-CM | POA: Diagnosis not present

## 2024-01-27 DIAGNOSIS — J301 Allergic rhinitis due to pollen: Secondary | ICD-10-CM | POA: Diagnosis not present

## 2024-01-29 DIAGNOSIS — K080 Exfoliation of teeth due to systemic causes: Secondary | ICD-10-CM | POA: Diagnosis not present

## 2024-02-02 DIAGNOSIS — J301 Allergic rhinitis due to pollen: Secondary | ICD-10-CM | POA: Diagnosis not present

## 2024-02-02 DIAGNOSIS — J3089 Other allergic rhinitis: Secondary | ICD-10-CM | POA: Diagnosis not present

## 2024-02-02 DIAGNOSIS — J3081 Allergic rhinitis due to animal (cat) (dog) hair and dander: Secondary | ICD-10-CM | POA: Diagnosis not present

## 2024-02-09 DIAGNOSIS — J301 Allergic rhinitis due to pollen: Secondary | ICD-10-CM | POA: Diagnosis not present

## 2024-02-09 DIAGNOSIS — J3089 Other allergic rhinitis: Secondary | ICD-10-CM | POA: Diagnosis not present

## 2024-02-09 DIAGNOSIS — J3081 Allergic rhinitis due to animal (cat) (dog) hair and dander: Secondary | ICD-10-CM | POA: Diagnosis not present

## 2024-02-11 DIAGNOSIS — M810 Age-related osteoporosis without current pathological fracture: Secondary | ICD-10-CM | POA: Diagnosis not present

## 2024-02-18 DIAGNOSIS — J301 Allergic rhinitis due to pollen: Secondary | ICD-10-CM | POA: Diagnosis not present

## 2024-02-18 DIAGNOSIS — J3089 Other allergic rhinitis: Secondary | ICD-10-CM | POA: Diagnosis not present

## 2024-02-18 DIAGNOSIS — J3081 Allergic rhinitis due to animal (cat) (dog) hair and dander: Secondary | ICD-10-CM | POA: Diagnosis not present

## 2024-02-25 DIAGNOSIS — J3081 Allergic rhinitis due to animal (cat) (dog) hair and dander: Secondary | ICD-10-CM | POA: Diagnosis not present

## 2024-02-25 DIAGNOSIS — J301 Allergic rhinitis due to pollen: Secondary | ICD-10-CM | POA: Diagnosis not present

## 2024-02-25 DIAGNOSIS — J3089 Other allergic rhinitis: Secondary | ICD-10-CM | POA: Diagnosis not present

## 2024-02-27 DIAGNOSIS — R1030 Lower abdominal pain, unspecified: Secondary | ICD-10-CM | POA: Diagnosis not present

## 2024-03-03 DIAGNOSIS — J3081 Allergic rhinitis due to animal (cat) (dog) hair and dander: Secondary | ICD-10-CM | POA: Diagnosis not present

## 2024-03-03 DIAGNOSIS — J301 Allergic rhinitis due to pollen: Secondary | ICD-10-CM | POA: Diagnosis not present

## 2024-03-03 DIAGNOSIS — J3089 Other allergic rhinitis: Secondary | ICD-10-CM | POA: Diagnosis not present

## 2024-03-09 ENCOUNTER — Other Ambulatory Visit: Payer: Self-pay | Admitting: Internal Medicine

## 2024-03-09 DIAGNOSIS — R1030 Lower abdominal pain, unspecified: Secondary | ICD-10-CM

## 2024-03-11 DIAGNOSIS — J3081 Allergic rhinitis due to animal (cat) (dog) hair and dander: Secondary | ICD-10-CM | POA: Diagnosis not present

## 2024-03-11 DIAGNOSIS — J3089 Other allergic rhinitis: Secondary | ICD-10-CM | POA: Diagnosis not present

## 2024-03-11 DIAGNOSIS — J301 Allergic rhinitis due to pollen: Secondary | ICD-10-CM | POA: Diagnosis not present

## 2024-03-16 ENCOUNTER — Ambulatory Visit
Admission: RE | Admit: 2024-03-16 | Discharge: 2024-03-16 | Disposition: A | Discharge status: Home / Self Care | Source: Ambulatory Visit | Attending: Internal Medicine | Admitting: Internal Medicine

## 2024-03-16 DIAGNOSIS — R1030 Lower abdominal pain, unspecified: Secondary | ICD-10-CM

## 2024-03-16 MED ORDER — IOPAMIDOL (ISOVUE-370) INJECTION 76%
500.0000 mL | Freq: Once | INTRAVENOUS | Status: AC | PRN
  Administered 2024-03-16: 70 mL via INTRAVENOUS

## 2024-03-18 ENCOUNTER — Encounter: Payer: Self-pay | Admitting: Internal Medicine

## 2024-03-18 ENCOUNTER — Other Ambulatory Visit: Payer: Self-pay | Admitting: Internal Medicine

## 2024-03-18 DIAGNOSIS — R109 Unspecified abdominal pain: Secondary | ICD-10-CM

## 2024-03-19 DIAGNOSIS — J3089 Other allergic rhinitis: Secondary | ICD-10-CM | POA: Diagnosis not present

## 2024-03-19 DIAGNOSIS — J301 Allergic rhinitis due to pollen: Secondary | ICD-10-CM | POA: Diagnosis not present

## 2024-03-19 DIAGNOSIS — J3081 Allergic rhinitis due to animal (cat) (dog) hair and dander: Secondary | ICD-10-CM | POA: Diagnosis not present

## 2024-03-25 ENCOUNTER — Ambulatory Visit
Admission: RE | Admit: 2024-03-25 | Discharge: 2024-03-25 | Disposition: A | Discharge status: Home / Self Care | Source: Ambulatory Visit | Attending: Internal Medicine | Admitting: Internal Medicine

## 2024-03-25 DIAGNOSIS — R109 Unspecified abdominal pain: Secondary | ICD-10-CM

## 2024-03-25 DIAGNOSIS — J301 Allergic rhinitis due to pollen: Secondary | ICD-10-CM | POA: Diagnosis not present

## 2024-03-25 DIAGNOSIS — I774 Celiac artery compression syndrome: Secondary | ICD-10-CM | POA: Diagnosis not present

## 2024-03-25 DIAGNOSIS — J3089 Other allergic rhinitis: Secondary | ICD-10-CM | POA: Diagnosis not present

## 2024-03-25 DIAGNOSIS — J3081 Allergic rhinitis due to animal (cat) (dog) hair and dander: Secondary | ICD-10-CM | POA: Diagnosis not present

## 2024-03-30 ENCOUNTER — Encounter: Payer: Self-pay | Admitting: Gastroenterology

## 2024-03-30 ENCOUNTER — Ambulatory Visit: Payer: Medicare Other | Admitting: Gastroenterology

## 2024-03-30 VITALS — BP 116/68 | HR 68 | Ht 61.0 in | Wt 96.2 lb

## 2024-03-30 DIAGNOSIS — K5900 Constipation, unspecified: Secondary | ICD-10-CM

## 2024-03-30 DIAGNOSIS — K219 Gastro-esophageal reflux disease without esophagitis: Secondary | ICD-10-CM | POA: Diagnosis not present

## 2024-03-30 DIAGNOSIS — K581 Irritable bowel syndrome with constipation: Secondary | ICD-10-CM

## 2024-03-30 DIAGNOSIS — E785 Hyperlipidemia, unspecified: Secondary | ICD-10-CM

## 2024-03-30 MED ORDER — PANTOPRAZOLE SODIUM 20 MG PO TBEC: 20.0000 mg | DELAYED_RELEASE_TABLET | Freq: Every day | ORAL | 3 refills | Status: AC

## 2024-03-30 NOTE — Patient Instructions (Addendum)
 VISIT SUMMARY:  Today, we discussed your ongoing digestive issues, including symptoms of gastroesophageal reflux disease (GERD) and constipation. We also reviewed your cholesterol management and possible fibromyalgia symptoms. We focused on dietary modifications and symptom management strategies to improve your overall well-being.  YOUR PLAN:  -GASTROESOPHAGEAL REFLUX DISEASE (GERD): GERD is a condition where stomach acid frequently flows back into the tube connecting your mouth and stomach, causing discomfort. To manage this, we recommend following a low FODMAP diet for two weeks and then gradually reintroducing foods to identify triggers. Eat small, frequent meals and avoid spicy, greasy, and acidic foods. You can take pantoprazole 20 mg as needed for acid reduction, and if symptoms are severe, you can take it daily for 1-2 weeks. Peppermint oil or tea can help with symptoms, and Bentyl  can be used as needed for spasms.  ZSW:FUXNAT the low FODMAP diet to identify foods that you can tolerate. Eat small, frequent meals and monitor your dietary intake to help manage your cholesterol levels.   -CONSTIPATION: Constipation is when you have infrequent bowel movements or difficulty passing stools. To help with this, take docusate daily at bedtime. Increase your intake of fruits and vegetables, especially cooked ones.  -HYPERLIPIDEMIA: Hyperlipidemia means you have high levels of cholesterol [place] your blood. Continue taking your cholesterol medication as tolerated and adjust based on any side effects.   -POSSIBLE FIBROMYALGIA: Fibromyalgia is a condition characterized by widespread musculoskeletal pain, fatigue, and tenderness Thurley localized areas. Since there is no definitive test for fibromyalgia, discuss potential treatment options with your primary care provider to manage your symptoms.  INSTRUCTIONS:  Follow up with your primary care provider to discuss potential treatment options for fibromyalgia.  Continue with the recommended dietary modifications and medications as discussed. If you have any new or worsening symptoms, please contact our office.   Low-FODMAP Eating Plan  FODMAP stands for fermentable oligosaccharides, disaccharides, monosaccharides, and polyols. These are sugars that are hard for some people to digest. A low-FODMAP eating plan may help some people who have irritable bowel syndrome (IBS) and certain other bowel (intestinal) diseases to manage their symptoms. This meal plan can be complicated to follow. Work with a diet and nutrition specialist (dietitian) to make a low-FODMAP eating plan that is right for you. A dietitian can help make sure that you get enough nutrition from this diet. What are tips for following this plan? Reading food labels Check labels for hidden FODMAPs such as: High-fructose syrup. Honey. Agave. Natural fruit flavors. Onion or garlic powder. Choose low-FODMAP foods that contain 3-4 grams of fiber per serving. Check food labels for serving sizes. Eat only one serving at a time to make sure FODMAP levels stay low. Shopping Shop with a list of foods that are recommended on this diet and make a meal plan. Meal planning Follow a low-FODMAP eating plan for up to 6 weeks, or as told by your health care provider or dietitian. To follow the eating plan: Eliminate high-FODMAP foods from your diet completely. Choose only low-FODMAP foods to eat. You will do this for 2-6 weeks. Gradually reintroduce high-FODMAP foods into your diet one at a time. Most people should wait a few days before introducing the next new high-FODMAP food into their meal plan. Your dietitian can recommend how quickly you may reintroduce foods. Keep a daily record of what and how much you eat and drink. Make note of any symptoms that you have after eating. Review your daily record with a dietitian regularly to identify  which foods you can eat and which foods you should avoid. General  tips Drink enough fluid each day to keep your urine pale yellow. Avoid processed foods. These often have added sugar and may be high [place] FODMAPs. Avoid most dairy products, whole grains, and sweeteners. Work with a dietitian to make sure you get enough fiber Liley your diet. Avoid high FODMAP foods at meals to manage symptoms. Recommended foods Fruits Bananas, oranges, tangerines, lemons, limes, blueberries, raspberries, strawberries, grapes, cantaloupe, honeydew melon, kiwi, papaya, passion fruit, and pineapple. Limited amounts of dried cranberries, banana chips, and shredded coconut. Vegetables Eggplant, zucchini, cucumber, peppers, green beans, bean sprouts, lettuce, arugula, kale, Swiss chard, spinach, collard greens, bok choy, summer squash, potato, and tomato. Limited amounts of corn, carrot, and sweet potato. Green parts of scallions. Grains Gluten-free grains, such as rice, oats, buckwheat, quinoa, corn, polenta, and millet. Gluten-free pasta, bread, or cereal. Rice noodles. Corn tortillas. Meats and other proteins Unseasoned beef, pork, poultry, or fish. Eggs. Helene Loader. Tofu (firm) and tempeh. Limited amounts of nuts and seeds, such as almonds, walnuts, [place] nuts, pecans, peanuts, nut butters, pumpkin seeds, chia seeds, and sunflower seeds. Dairy Lactose-free milk, yogurt, and kefir. Lactose-free cottage cheese and ice cream. Non-dairy milks, such as almond, coconut, hemp, and rice milk. Non-dairy yogurt. Limited amounts of goat cheese, brie, mozzarella, parmesan, swiss, and other hard cheeses. Fats and oils Butter-free spreads. Vegetable oils, such as olive, canola, and sunflower oil. Seasoning and other foods Artificial sweeteners with names that do not end Zaida "ol," such as aspartame, saccharine, and stevia. Maple syrup, white table sugar, raw sugar, brown sugar, and molasses. Mayonnaise, soy sauce, and tamari. Fresh basil, coriander, parsley, rosemary, and thyme. Beverages Water and  mineral water. Sugar-sweetened soft drinks. Small amounts of orange juice or cranberry juice. Black and green tea. Most dry wines. Coffee. The items listed above may not be a complete list of foods and beverages you can eat. Contact a dietitian for more information. Foods to avoid Fruits Fresh, dried, and juiced forms of apple, pear, watermelon, peach, plum, cherries, apricots, blackberries, boysenberries, figs, nectarines, and mango. Avocado. Vegetables Chicory root, artichoke, asparagus, cabbage, snow peas, Brussels sprouts, broccoli, sugar snap peas, mushrooms, celery, and cauliflower. Onions, garlic, leeks, and the white part of scallions. Grains Wheat, including kamut, durum, and semolina. Barley and bulgur. Couscous. Wheat-based cereals. Wheat noodles, bread, crackers, and pastries. Meats and other proteins Fried or fatty meat. Sausage. Cashews and pistachios. Soybeans, baked beans, black beans, chickpeas, kidney beans, fava beans, navy beans, lentils, black-eyed peas, and split peas. Dairy Milk, yogurt, ice cream, and soft cheese. Cream and sour cream. Milk-based sauces. Custard. Buttermilk. Soy milk. Seasoning and other foods Any sugar-free gum or candy. Foods that contain artificial sweeteners such as sorbitol, mannitol, isomalt, or xylitol. Foods that contain honey, high-fructose corn syrup, or agave. Bouillon, vegetable stock, beef stock, and chicken stock. Garlic and onion powder. Condiments made with onion, such as hummus, chutney, pickles, relish, salad dressing, and salsa. Tomato paste. Beverages Chicory-based drinks. Coffee substitutes. Chamomile tea. Fennel tea. Sweet or fortified wines such as port or sherry. Diet soft drinks made with isomalt, mannitol, maltitol, sorbitol, or xylitol. Apple, pear, and mango juice. Juices with high-fructose corn syrup. The items listed above may not be a complete list of foods and beverages you should avoid. Contact a dietitian for more  information. Summary FODMAP stands for fermentable oligosaccharides, disaccharides, monosaccharides, and polyols. These are sugars that are hard for some people to digest.  A low-FODMAP eating plan is a short-term diet that helps to ease symptoms of certain bowel diseases. The eating plan usually lasts up to 6 weeks. After that, high-FODMAP foods are reintroduced gradually and one at a time. This can help you find out which foods may be causing symptoms. A low-FODMAP eating plan can be complicated. It is best to work with a dietitian who has experience with this type of plan. This information is not intended to replace advice given to you by your health care provider. Make sure you discuss any questions you have with your health care provider. Document Revised: 03/23/2020 Document Reviewed: 03/23/2020 Elsevier Patient Education  2024 [employer].  I appreciate the  opportunity to care for you  Thank You   Kavitha Nandigam , MD

## 2024-03-30 NOTE — Progress Notes (Unsigned)
 Katrina Robertson    [id number]    02-17-1956  Primary Care Physician:Skakle, Katrina Holster, DO  Referring Physician: Windell Hasty, DO [address] [place],  [place] [address]   Chief complaint:  Constipation, abdominal pain  Discussed the use of AI scribe software for clinical note transcription with the patient, who gave verbal consent to proceed.  History of Present Illness Katrina Robertson is a 68 year old female with irritable bowel syndrome who presents with digestive issues. She is accompanied by her partner, who assists with cooking and dietary management.  She experiences ongoing digestive issues, primarily affecting her stomach and colon. A burning sensation occurs with spicy and acidic foods, and greasy foods also cause discomfort. She has adjusted her diet to include safer options like cereal, rice, and chicken without spices. She avoids dairy and has cut out greasy foods, which she has identified as triggers.  She experiences a frequent urge to have a bowel movement but often feels incomplete evacuation. She has a bowel movement once a day but feels she is not fully emptying. She has a history of IBS since childhood, which significantly improved with dietary changes and medication [name] her 30s. She has tried various medications, including FD Guard and IB Guard, with some relief. She also uses peppermint oil and tea for symptom management.  She has undergone extensive testing, including CT scans and ultrasounds, which have ruled out cancer and inflammatory bowel disease. Her recent CT scan showed no significant plaque buildup, and her cholesterol is managed with medication, which she takes twice weekly without side effects.  She experiences fatigue and discomfort, which may be related to fibromyalgia, although this is not confirmed. She is very tired. GI Hx:   Ultrasound of mesenteric arteries 03/25/24 Scattered atherosclerotic plaque identified. There is some elevated velocity  of the celiac origin with expiration and a stenosis of just under 50% by CT.  CT abdomen with contrast 03/16/24 No acute abnormality  Normal gastric emptying scan 08/26/23  Laryngoscopy 05-29-23 Flexible laryngoscopy shows patent anterior nasal cavity with minimal  crusting, no discharge or infection.  Very mild interarytenoid edema and erythema, appears improved from  previous exam.  Otherwise normal base of tongue and supraglottis  Normal vocal cord mobility without vocal cord nodule, mass, polyp or  tumor. Hypopharynx normal without mass, pooling of secretions or  aspiration.    US  Abdomen Limited RUQ 04-22-23 No cholelithiasis or sonographic evidence for acute cholecystitis.  Flexible laryngoscopy shows patent anterior nasal cavity with minimal  crusting, no discharge or infection. Very mild interarytenoid edema and erythema, appears improved from  previous exam.  Otherwise normal base of tongue and supraglottis  Normal vocal cord mobility without vocal cord nodule, mass, polyp or  tumor. Hypopharynx normal without mass, pooling of secretions or  aspiration.    EGD 01-24-23 - LA Grade B esophagitis with no bleeding. Dilated.  - Gastritis. Biopsied.  - Normal examined duodenum.  - Biopsies were taken with a cold forceps for evaluation of eosinophilic esophagitis. 1. Surgical [P], gastric antrum and gastric body - GASTRIC ANTRAL AND OXYNTIC MUCOSA WITH NO SPECIFIC HISTOPATHOLOGIC CHANGES - HELICOBACTER PYLORI-LIKE ORGANISMS ARE NOT IDENTIFIED ON ROUTINE H&E STAIN 2. Surgical [P], distal esophagus - ESOPHAGEAL SQUAMOUS AND CARDIAC MUCOSA WITH REFLUX-ASSOCIATED CHANGES - NEGATIVE FOR INTESTINAL METAPLASIA OR DYSPLASIA   Colonoscopy 01-24-23 - Four 1 to 2 mm polyps Katrina Robertson the transverse colon and Katrina Robertson the ascending colon, removed with a  cold biopsy forceps. Resected and retrieved.  - Non-bleeding external and internal hemorrhoids.  - The examination was otherwise normal. - The GI Genius  (intelligent endoscopy module), computer-aided polyp detection system powered by AI was utilized to detect colorectal polyps through enhanced visualization during colonoscopy. 3. 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   EPINEPHrine 0.3 mg/0.3 mL IJ SOAJ injection    famotidine  (PEPCID ) 20 MG tablet Take 20 mg by mouth at bedtime.   levocetirizine (XYZAL) 5 MG tablet Take 1 tablet by mouth every evening.   metoprolol (LOPRESSOR) 50 MG tablet Take 50 mg by mouth 2 (two) times daily. Takes 1/2 tablet twice daily   pantoprazole (PROTONIX) 40 MG tablet Take 40 mg by mouth 2 (two) times daily before a meal.   PROLIA  60 MG/ML SOSY injection    rosuvastatin (CRESTOR) 5 MG tablet Take 1 tablet by mouth every other day.   Timolol Maleate PF 0.5 % SOLN Place 1 drop into both eyes daily.   zolpidem (AMBIEN) 10 MG tablet TAKE 1/2 TO 1 (ONE-HALF TO ONE) TABLET BY MOUTH AT BEDTIME AS NEEDED FOR INSOMNIA   fluticasone (FLONASE) 50 MCG/ACT nasal spray Place into the nose.   [DISCONTINUED] Calcium Carbonate (CALCIUM 600 PO) Take 1 tablet by mouth daily.   No facility-administered encounter medications on file as of 03/30/2024.    Allergies as of 03/30/2024 - Review Complete 03/30/2024  Allergen Reaction Noted   Cefdinir Diarrhea 11/15/2022   Gramineae pollens Itching 02/05/2022   Penicillins Nausea And Vomiting 11/15/2022   Sulfa antibiotics Nausea And Vomiting 10/01/2013    Past Medical History:  Diagnosis Date   Allergic rhinitis    Deviated septum    GERD (gastroesophageal reflux disease)    Hypertension    Osteopenia    Osteoporosis     Past Surgical History:  Procedure Laterality  Date   THYROIDECTOMY, PARTIAL  1990   TOTAL ABDOMINAL HYSTERECTOMY  1996    Family History  Problem Relation Age of Onset   Stroke Father    Cancer Sister    Colon cancer Neg Hx    Stomach cancer Neg Hx    Esophageal cancer Neg Hx    Rectal cancer Neg Hx    Colon polyps Neg Hx     Social History   Socioeconomic History   Marital status: Married    Spouse name: Not on file   Number of children: 1   Years of education: 14   Highest education level: Not on file  Occupational History    Comment: Copywriter, advertising   Occupation: retired  Tobacco Use   Smoking status: Never   Smokeless tobacco: Never  Vaping Use   Vaping status: Never Used  Substance and Sexual Activity   Alcohol  use: No    Alcohol /week: 1.0 standard drink of alcohol     Types: 1 Glasses of wine per week   Drug use: No   Sexual activity: Not on file  Other Topics Concern   Not on file  Social History Narrative   Lives with husband    caffeine- coffee 1 cup daily   Social Drivers of Corporate investment banker Strain: Not on file  Food  Insecurity: Not on file  Transportation Needs: Not on file  Physical Activity: Not on file  Stress: Not on file  Social Connections: Not on file  Intimate Partner Violence: Not on file      Review of systems: All other review of systems negative except as mentioned Chidinma the HPI.   Physical Exam: Vitals:   03/30/24 1428  BP: 116/68  Pulse: 68  SpO2: 97%   Body mass index is 18.19 kg/m. Gen:      No acute distress HEENT:  sclera anicteric CV: s1s2 rrr, no murmur Lungs: B/l clear. Abd:      soft, non-tender; no palpable masses, no distension Ext:    No edema Neuro: alert and oriented x 3 Psych: normal mood and affect  Data Reviewed:  Reviewed labs, radiology imaging, old records and pertinent past GI work up     Assessment and Plan Assessment & Plan Gastroesophageal reflux disease Chronic gastroesophageal reflux disease with symptoms  exacerbated by spicy, greasy, and acidic foods, including burning sensation and discomfort. No structural abnormalities found Rieley recent testing. Management focuses on dietary modifications and symptomatic relief. Reassurance provided as testing showed no cancer or inflammatory bowel disease. - Recommend low FODMAP diet for two weeks, then gradually reintroduce foods to identify triggers. - Advise small, frequent meals and avoidance of spicy, greasy, and acidic foods. - Prescribe pantoprazole 20 mg as needed for acid reduction, with the option to take daily for 1-2 weeks if symptoms are severe. - Discuss the use of peppermint oil or tea for symptomatic relief. - Encourage the use of Bentyl  as needed for spasms.  Constipation Chronic constipation with sensation of incomplete evacuation. No structural abnormalities found Riann recent testing. Management focuses on dietary modifications and regular use of stool softeners. Emphasis on trial and error with diet to identify tolerable foods. - Recommend daily use of docusate at bedtime. - Encourage increased intake of fruits and vegetables, particularly cooked vegetables. - Provide information on low FODMAP diet to identify tolerable foods.  Hyperlipidemia Hyperlipidemia with elevated cholesterol. Recent CT scan shows non-critical plaque buildup, typical for age, below 50%. Management includes dietary modifications and cholesterol-lowering medication as tolerated. - Continue cholesterol medication as tolerated, with adjustments based on side effects. - Advise small, frequent meals and monitoring of dietary intake.  Possible fibromyalgia Possible fibromyalgia with symptoms of fatigue and discomfort. No definitive testing available for diagnosis. Management options include medications with potential side effects, requiring discussion with primary care provider. - Recommend discussing potential fibromyalgia treatment options with primary care  provider.      This visit required *** minutes of patient care (this includes precharting, chart review, review of results, face-to-face time used for counseling as well as treatment plan and follow-up. The patient was provided an opportunity to ask questions and all were answered. The patient agreed with the plan and demonstrated an understanding of the instructions.  Lorena Rolling , MD    CC: Katrina Hasty, DO

## 2024-04-01 ENCOUNTER — Encounter: Payer: Self-pay | Admitting: Gastroenterology

## 2024-04-01 DIAGNOSIS — H25813 Combined forms of age-related cataract, bilateral: Secondary | ICD-10-CM | POA: Diagnosis not present

## 2024-04-01 DIAGNOSIS — H401131 Primary open-angle glaucoma, bilateral, mild stage: Secondary | ICD-10-CM | POA: Diagnosis not present

## 2024-04-01 DIAGNOSIS — H353131 Nonexudative age-related macular degeneration, bilateral, early dry stage: Secondary | ICD-10-CM | POA: Diagnosis not present

## 2024-04-01 DIAGNOSIS — H04123 Dry eye syndrome of bilateral lacrimal glands: Secondary | ICD-10-CM | POA: Diagnosis not present

## 2024-04-02 DIAGNOSIS — J3081 Allergic rhinitis due to animal (cat) (dog) hair and dander: Secondary | ICD-10-CM | POA: Diagnosis not present

## 2024-04-02 DIAGNOSIS — J3089 Other allergic rhinitis: Secondary | ICD-10-CM | POA: Diagnosis not present

## 2024-04-02 DIAGNOSIS — J301 Allergic rhinitis due to pollen: Secondary | ICD-10-CM | POA: Diagnosis not present

## 2024-04-13 ENCOUNTER — Telehealth: Payer: Self-pay | Admitting: Gastroenterology

## 2024-04-13 DIAGNOSIS — J301 Allergic rhinitis due to pollen: Secondary | ICD-10-CM | POA: Diagnosis not present

## 2024-04-13 DIAGNOSIS — J3089 Other allergic rhinitis: Secondary | ICD-10-CM | POA: Diagnosis not present

## 2024-04-13 DIAGNOSIS — J3081 Allergic rhinitis due to animal (cat) (dog) hair and dander: Secondary | ICD-10-CM | POA: Diagnosis not present

## 2024-04-13 NOTE — Telephone Encounter (Signed)
 Inbound call from patient requesting to discuss referral to dietitian. States she is unfamiliar with any dietitians. Please advise, thank you.

## 2024-04-14 NOTE — Telephone Encounter (Signed)
 Offered the patient the name of dietician Arcola Beards, RD at [phone]. She can self-refer.

## 2024-04-19 DIAGNOSIS — J3081 Allergic rhinitis due to animal (cat) (dog) hair and dander: Secondary | ICD-10-CM | POA: Diagnosis not present

## 2024-04-19 DIAGNOSIS — J3089 Other allergic rhinitis: Secondary | ICD-10-CM | POA: Diagnosis not present

## 2024-04-19 DIAGNOSIS — J301 Allergic rhinitis due to pollen: Secondary | ICD-10-CM | POA: Diagnosis not present

## 2024-04-26 DIAGNOSIS — J301 Allergic rhinitis due to pollen: Secondary | ICD-10-CM | POA: Diagnosis not present

## 2024-04-26 DIAGNOSIS — J3089 Other allergic rhinitis: Secondary | ICD-10-CM | POA: Diagnosis not present

## 2024-04-26 DIAGNOSIS — J3081 Allergic rhinitis due to animal (cat) (dog) hair and dander: Secondary | ICD-10-CM | POA: Diagnosis not present

## 2024-05-04 DIAGNOSIS — J3089 Other allergic rhinitis: Secondary | ICD-10-CM | POA: Diagnosis not present

## 2024-05-04 DIAGNOSIS — J3081 Allergic rhinitis due to animal (cat) (dog) hair and dander: Secondary | ICD-10-CM | POA: Diagnosis not present

## 2024-05-04 DIAGNOSIS — J301 Allergic rhinitis due to pollen: Secondary | ICD-10-CM | POA: Diagnosis not present

## 2024-05-06 DIAGNOSIS — H401131 Primary open-angle glaucoma, bilateral, mild stage: Secondary | ICD-10-CM | POA: Diagnosis not present

## 2024-05-06 DIAGNOSIS — H04123 Dry eye syndrome of bilateral lacrimal glands: Secondary | ICD-10-CM | POA: Diagnosis not present

## 2024-05-14 DIAGNOSIS — J301 Allergic rhinitis due to pollen: Secondary | ICD-10-CM | POA: Diagnosis not present

## 2024-05-14 DIAGNOSIS — J3081 Allergic rhinitis due to animal (cat) (dog) hair and dander: Secondary | ICD-10-CM | POA: Diagnosis not present

## 2024-05-14 DIAGNOSIS — J3089 Other allergic rhinitis: Secondary | ICD-10-CM | POA: Diagnosis not present

## 2024-05-24 DIAGNOSIS — J3089 Other allergic rhinitis: Secondary | ICD-10-CM | POA: Diagnosis not present

## 2024-05-24 DIAGNOSIS — J3081 Allergic rhinitis due to animal (cat) (dog) hair and dander: Secondary | ICD-10-CM | POA: Diagnosis not present

## 2024-05-24 DIAGNOSIS — J301 Allergic rhinitis due to pollen: Secondary | ICD-10-CM | POA: Diagnosis not present

## 2024-06-02 DIAGNOSIS — J301 Allergic rhinitis due to pollen: Secondary | ICD-10-CM | POA: Diagnosis not present

## 2024-06-02 DIAGNOSIS — J3089 Other allergic rhinitis: Secondary | ICD-10-CM | POA: Diagnosis not present

## 2024-06-02 DIAGNOSIS — J3081 Allergic rhinitis due to animal (cat) (dog) hair and dander: Secondary | ICD-10-CM | POA: Diagnosis not present

## 2024-06-08 DIAGNOSIS — Z1389 Encounter for screening for other disorder: Secondary | ICD-10-CM | POA: Diagnosis not present

## 2024-06-08 DIAGNOSIS — J301 Allergic rhinitis due to pollen: Secondary | ICD-10-CM | POA: Diagnosis not present

## 2024-06-08 DIAGNOSIS — E785 Hyperlipidemia, unspecified: Secondary | ICD-10-CM | POA: Diagnosis not present

## 2024-06-08 DIAGNOSIS — J3089 Other allergic rhinitis: Secondary | ICD-10-CM | POA: Diagnosis not present

## 2024-06-08 DIAGNOSIS — J3081 Allergic rhinitis due to animal (cat) (dog) hair and dander: Secondary | ICD-10-CM | POA: Diagnosis not present

## 2024-06-08 DIAGNOSIS — E039 Hypothyroidism, unspecified: Secondary | ICD-10-CM | POA: Diagnosis not present

## 2024-06-15 DIAGNOSIS — R82998 Other abnormal findings in urine: Secondary | ICD-10-CM | POA: Diagnosis not present

## 2024-06-15 DIAGNOSIS — M810 Age-related osteoporosis without current pathological fracture: Secondary | ICD-10-CM | POA: Diagnosis not present

## 2024-06-15 DIAGNOSIS — I10 Essential (primary) hypertension: Secondary | ICD-10-CM | POA: Diagnosis not present

## 2024-06-15 DIAGNOSIS — R1030 Lower abdominal pain, unspecified: Secondary | ICD-10-CM | POA: Diagnosis not present

## 2024-06-15 DIAGNOSIS — Z0000 Encounter for general adult medical examination without abnormal findings: Secondary | ICD-10-CM | POA: Diagnosis not present

## 2024-06-16 DIAGNOSIS — J3081 Allergic rhinitis due to animal (cat) (dog) hair and dander: Secondary | ICD-10-CM | POA: Diagnosis not present

## 2024-06-16 DIAGNOSIS — J301 Allergic rhinitis due to pollen: Secondary | ICD-10-CM | POA: Diagnosis not present

## 2024-06-16 DIAGNOSIS — J3089 Other allergic rhinitis: Secondary | ICD-10-CM | POA: Diagnosis not present

## 2024-06-24 DIAGNOSIS — J3081 Allergic rhinitis due to animal (cat) (dog) hair and dander: Secondary | ICD-10-CM | POA: Diagnosis not present

## 2024-06-24 DIAGNOSIS — J301 Allergic rhinitis due to pollen: Secondary | ICD-10-CM | POA: Diagnosis not present

## 2024-06-24 DIAGNOSIS — J3089 Other allergic rhinitis: Secondary | ICD-10-CM | POA: Diagnosis not present

## 2024-06-29 DIAGNOSIS — J3089 Other allergic rhinitis: Secondary | ICD-10-CM | POA: Diagnosis not present

## 2024-06-29 DIAGNOSIS — J301 Allergic rhinitis due to pollen: Secondary | ICD-10-CM | POA: Diagnosis not present

## 2024-07-01 DIAGNOSIS — J3089 Other allergic rhinitis: Secondary | ICD-10-CM | POA: Diagnosis not present

## 2024-07-07 DIAGNOSIS — J3089 Other allergic rhinitis: Secondary | ICD-10-CM | POA: Diagnosis not present

## 2024-07-07 DIAGNOSIS — J301 Allergic rhinitis due to pollen: Secondary | ICD-10-CM | POA: Diagnosis not present

## 2024-07-12 ENCOUNTER — Telehealth: Payer: Self-pay

## 2024-07-12 NOTE — Telephone Encounter (Signed)
 S/w patient who had requested that appointments be done earlier.  Per Dr. Lonn, okay to move-up patient's new hem appointments. Patient rescheduled for 9/12.  New appointment date and times confirmed with patient. Patient voiced appreciation for the call.

## 2024-07-15 DIAGNOSIS — J3081 Allergic rhinitis due to animal (cat) (dog) hair and dander: Secondary | ICD-10-CM | POA: Diagnosis not present

## 2024-07-15 DIAGNOSIS — T565X4D Toxic effect of zinc and its compounds, undetermined, subsequent encounter: Secondary | ICD-10-CM | POA: Diagnosis not present

## 2024-07-15 DIAGNOSIS — J301 Allergic rhinitis due to pollen: Secondary | ICD-10-CM | POA: Diagnosis not present

## 2024-07-15 DIAGNOSIS — J3089 Other allergic rhinitis: Secondary | ICD-10-CM | POA: Diagnosis not present

## 2024-07-20 DIAGNOSIS — K080 Exfoliation of teeth due to systemic causes: Secondary | ICD-10-CM | POA: Diagnosis not present

## 2024-07-22 DIAGNOSIS — J3089 Other allergic rhinitis: Secondary | ICD-10-CM | POA: Diagnosis not present

## 2024-07-22 DIAGNOSIS — J301 Allergic rhinitis due to pollen: Secondary | ICD-10-CM | POA: Diagnosis not present

## 2024-07-22 DIAGNOSIS — J3081 Allergic rhinitis due to animal (cat) (dog) hair and dander: Secondary | ICD-10-CM | POA: Diagnosis not present

## 2024-07-28 DIAGNOSIS — J3089 Other allergic rhinitis: Secondary | ICD-10-CM | POA: Diagnosis not present

## 2024-07-28 DIAGNOSIS — J3081 Allergic rhinitis due to animal (cat) (dog) hair and dander: Secondary | ICD-10-CM | POA: Diagnosis not present

## 2024-07-28 DIAGNOSIS — J301 Allergic rhinitis due to pollen: Secondary | ICD-10-CM | POA: Diagnosis not present

## 2024-07-30 ENCOUNTER — Other Ambulatory Visit: Payer: Self-pay

## 2024-07-30 ENCOUNTER — Encounter: Payer: Self-pay | Admitting: Hematology and Oncology

## 2024-07-30 ENCOUNTER — Inpatient Hospital Stay

## 2024-07-30 ENCOUNTER — Inpatient Hospital Stay: Attending: Hematology and Oncology | Admitting: Hematology and Oncology

## 2024-07-30 DIAGNOSIS — E559 Vitamin D deficiency, unspecified: Secondary | ICD-10-CM

## 2024-07-30 DIAGNOSIS — D8989 Other specified disorders involving the immune mechanism, not elsewhere classified: Secondary | ICD-10-CM | POA: Insufficient documentation

## 2024-07-30 DIAGNOSIS — M2550 Pain in unspecified joint: Secondary | ICD-10-CM

## 2024-07-30 DIAGNOSIS — R7989 Other specified abnormal findings of blood chemistry: Secondary | ICD-10-CM | POA: Diagnosis not present

## 2024-07-30 DIAGNOSIS — M6284 Sarcopenia: Secondary | ICD-10-CM | POA: Insufficient documentation

## 2024-07-30 DIAGNOSIS — M810 Age-related osteoporosis without current pathological fracture: Secondary | ICD-10-CM | POA: Insufficient documentation

## 2024-07-30 LAB — CBC WITH DIFFERENTIAL (CANCER CENTER ONLY)
Abs Immature Granulocytes: 0.01 K/uL (ref 0.00–0.07)
Basophils Absolute: 0 K/uL (ref 0.0–0.1)
Basophils Relative: 0 %
Eosinophils Absolute: 0.2 K/uL (ref 0.0–0.5)
Eosinophils Relative: 3 %
HCT: 40.2 % (ref 36.0–46.0)
Hemoglobin: 13.6 g/dL (ref 12.0–15.0)
Immature Granulocytes: 0 %
Lymphocytes Relative: 33 %
Lymphs Abs: 2 K/uL (ref 0.7–4.0)
MCH: 31.3 pg (ref 26.0–34.0)
MCHC: 33.8 g/dL (ref 30.0–36.0)
MCV: 92.4 fL (ref 80.0–100.0)
Monocytes Absolute: 0.5 K/uL (ref 0.1–1.0)
Monocytes Relative: 9 %
Neutro Abs: 3.3 K/uL (ref 1.7–7.7)
Neutrophils Relative %: 55 %
Platelet Count: 223 K/uL (ref 150–400)
RBC: 4.35 MIL/uL (ref 3.87–5.11)
RDW: 12 % (ref 11.5–15.5)
WBC Count: 6 K/uL (ref 4.0–10.5)
nRBC: 0 % (ref 0.0–0.2)

## 2024-07-30 LAB — VITAMIN D 25 HYDROXY (VIT D DEFICIENCY, FRACTURES): Vit D, 25-Hydroxy: 31.65 ng/mL (ref 30–100)

## 2024-07-30 LAB — SEDIMENTATION RATE: Sed Rate: 7 mm/h (ref 0–22)

## 2024-07-30 LAB — FERRITIN: Ferritin: 251 ng/mL (ref 11–307)

## 2024-07-30 NOTE — Assessment & Plan Note (Signed)
 The cause of her elevated ferritin is unknown I will order inflammatory markers since ferritin is an acute phase reactant We also discussed ordering PCR test to rule out hemochromatosis and she agreed If repeat ferritin level is still persistently elevated, I will order phlebotomy Ruta her next visit

## 2024-07-30 NOTE — Assessment & Plan Note (Signed)
 I plan to check vitamin D  level We discussed potential restarting her on estrogen replacement therapy to battle her osteoporosis

## 2024-07-30 NOTE — Assessment & Plan Note (Signed)
 She has clear signs of sarcopenia I gave the patient resources to track her food intake, more specifically her oral protein intake I reviewed with her next visit We discussed importance of integrating strength training component/resistance training to battle sarcopenia

## 2024-07-30 NOTE — Assessment & Plan Note (Signed)
 She has diffuse musculoskeletal pain Certainly, elevated ferritin can cause arthropathy from deposition of iron but typically we do not see that problem unless ferritin is well over 500 I will order autoimmune screen I suspect her diffuse joint pain and musculoskeletal pain could be related to estrogen depletion

## 2024-07-30 NOTE — Progress Notes (Signed)
 [facility] CONSULT NOTE  Patient Care Team: Valentin Skates, DO as PCP - General (Internal Medicine) Dominica Decamp, NP as Nurse Practitioner (Nurse Practitioner)  ASSESSMENT & PLAN:  Elevated ferritin The cause of her elevated ferritin is unknown I will order inflammatory markers since ferritin is an acute phase reactant We also discussed ordering PCR test to rule out hemochromatosis and she agreed If repeat ferritin level is still persistently elevated, I will order phlebotomy [place] her next visit  Joint pain She has diffuse musculoskeletal pain Certainly, elevated ferritin can cause arthropathy from deposition of iron but typically we do not see that problem unless ferritin is well over 500 I will order autoimmune screen I suspect her diffuse joint pain and musculoskeletal pain could be related to estrogen depletion  Sarcopenia She has clear signs of sarcopenia I gave the patient resources to track her food intake, more specifically her oral protein intake I reviewed with her next visit We discussed importance of integrating strength training component/resistance training to battle sarcopenia  Osteoporosis I plan to check vitamin D  level We discussed potential restarting her on estrogen replacement therapy to battle her osteoporosis  Orders Placed This Encounter  Procedures   CBC with Differential (Cancer Center Only)    Standing Status:   Future    Number of Occurrences:   1    Expiration Date:   07/30/2025   Ferritin    Standing Status:   Future    Number of Occurrences:   1    Expiration Date:   07/30/2025   Sedimentation rate    Standing Status:   Future    Number of Occurrences:   1    Expiration Date:   07/30/2025   VITAMIN D  25 Hydroxy (Vit-D Deficiency, Fractures)    Standing Status:   Future    Number of Occurrences:   1    Expiration Date:   07/30/2025   Hemochromatosis DNA, PCR    Standing Status:   Future    Number of Occurrences:   1     Expiration Date:   07/30/2025   Cyclic Citrul Peptide Ab, IgG, IgA    Standing Status:   Future    Number of Occurrences:   1    Expiration Date:   07/30/2025   Rheumatoid factor    Standing Status:   Future    Number of Occurrences:   1    Expiration Date:   07/30/2025   ANA, IFA (with reflex)    Standing Status:   Future    Number of Occurrences:   1    Expiration Date:   07/30/2025    The total time spent [name] the appointment was 60 minutes encounter with patients including review of chart and various tests results, discussions about plan of care and coordination of care plan   All questions were answered. The patient knows to call the clinic with any problems, questions or concerns. No barriers to learning was detected.  [name], MD 9/12/20253:52 PM  CHIEF COMPLAINTS/PURPOSE OF CONSULTATION:  Diffuse joint pain, elevated ferritin  HISTORY OF PRESENTING ILLNESS:  Katrina Robertson 68 y.o. female is here because of abnormal blood work Jaylene the setting of diffuse joint pain She is here accompanied by her husband She is retired She has remote history of abdominal hysterectomy due to uterine fibroids before the age of 53 She was placed on hormone replacement therapy for 10 years but subsequently was discontinued for unknown reason She  has been complaining of diffuse musculoskeletal pain She was found to have osteoporosis and has been receiving intermittent doses of Prolia  That seems to aggravate some of her discomfort I have reviewed her outside records and collaborated the history with the patient The patient had autoimmune screen for Sjogren's Evalyse 2021 and 2023 and that came back negative On June 04, 2022, ferritin level came back 246 On July 11, 2023, serum zinc  level was elevated, thought to be related to a supplement.  Repeat labs Loda October 24 show her zinc  level has improved back to normal since discontinuation of her supplement On June 09, 2024, she had repeat ferritin level that  came back elevated at 354 She denies taking oral iron supplement She has never received blood transfusion support I reviewed family history; both parents have heart disease  MEDICAL HISTORY:  Past Medical History:  Diagnosis Date   Allergic rhinitis    Deviated septum    GERD (gastroesophageal reflux disease) 2023   Hyperlipidemia    Hypertension 2000   Osteopenia    Osteoporosis     SURGICAL HISTORY: Past Surgical History:  Procedure Laterality Date   THYROIDECTOMY, PARTIAL  1990   TOTAL ABDOMINAL HYSTERECTOMY  1996    SOCIAL HISTORY: Social History   Socioeconomic History   Marital status: Married    Spouse name: Not on file   Number of children: 1   Years of education: 14   Highest education level: Not on file  Occupational History    Comment: Copywriter, advertising   Occupation: retired   Occupation: retired Geneticist, molecular  Tobacco Use   Smoking status: Never   Smokeless tobacco: Never  Vaping Use   Vaping status: Never Used  Substance and Sexual Activity   Alcohol  use: No    Alcohol /week: 1.0 standard drink of alcohol     Types: 1 Glasses of wine per week   Drug use: No   Sexual activity: Not on file  Other Topics Concern   Not on file  Social History Narrative   Lives with husband    caffeine- coffee 1 cup daily   Social Drivers of Corporate investment banker Strain: Not on file  Food Insecurity: Not on file  Transportation Needs: Not on file  Physical Activity: Not on file  Stress: Not on file  Social Connections: Not on file  Intimate Partner Violence: Not on file    FAMILY HISTORY: Family History  Problem Relation Age of Onset   Stroke Father    Cancer Sister    Colon cancer Neg Hx    Stomach cancer Neg Hx    Esophageal cancer Neg Hx    Rectal cancer Neg Hx    Colon polyps Neg Hx     ALLERGIES:  is allergic to cefdinir, gramineae pollens, penicillins, and sulfa antibiotics.  MEDICATIONS:  Current Outpatient Medications  Medication Sig  Dispense Refill   fluticasone (FLONASE) 50 MCG/ACT nasal spray Place into the nose. (Patient taking differently: Place 1 spray into both nostrils.)     levocetirizine (XYZAL) 5 MG tablet Take 1 tablet by mouth every evening.     metoprolol (LOPRESSOR) 50 MG tablet Take 50 mg by mouth 2 (two) times daily. Takes 1/2 tablet twice daily     pantoprazole  (PROTONIX ) 20 MG tablet Take 1 tablet (20 mg total) by mouth daily. 90 tablet 3   rosuvastatin (CRESTOR) 5 MG tablet Take 1 tablet by mouth every other day. (Patient taking differently: Take 1 tablet by mouth  2 (two) times a week.)     Timolol Maleate PF 0.5 % SOLN Place 1 drop into both eyes daily.     zolpidem (AMBIEN) 10 MG tablet TAKE 1/2 TO 1 (ONE-HALF TO ONE) TABLET BY MOUTH AT BEDTIME AS NEEDED FOR INSOMNIA     B Complex CAPS Take 1 capsule by mouth daily. (Patient not taking: Reported on 07/30/2024)     EPINEPHrine 0.3 mg/0.3 mL IJ SOAJ injection      famotidine  (PEPCID ) 20 MG tablet Take 20 mg by mouth at bedtime.     PREVNAR 20 0.5 ML injection      PROLIA  60 MG/ML SOSY injection      No current facility-administered medications for this visit.    REVIEW OF SYSTEMS:   Constitutional: Denies fevers, chills or abnormal night sweats Eyes: Denies blurriness of vision, double vision or watery eyes Ears, nose, mouth, throat, and face: Denies mucositis or sore throat Respiratory: Denies cough, dyspnea or wheezes Cardiovascular: Denies palpitation, chest discomfort or lower extremity swelling Gastrointestinal:  Denies nausea, heartburn or change Khristina bowel habits Skin: Denies abnormal skin rashes Lymphatics: Denies new lymphadenopathy or easy bruising Neurological:Denies numbness, tingling or new weaknesses Behavioral/Psych: Mood is stable, no new changes  All other systems were reviewed with the patient and are negative.  PHYSICAL EXAMINATION: ECOG PERFORMANCE STATUS: 0 - Asymptomatic  Vitals:   07/30/24 1317  BP: (!) 130/51  Pulse: 60   Resp: 18  Temp: 97.6 F (36.4 C)  SpO2: 100%   Filed Weights   07/30/24 1317  Weight: 96 lb 9.6 oz (43.8 kg)    GENERAL:alert, no distress and comfortable.  She looks thin.  Signs of sarcopenia SKIN: skin color, texture, turgor are normal, no rashes or significant lesions EYES: normal, conjunctiva are pink and non-injected, sclera clear OROPHARYNX:no exudate, no erythema and lips, buccal mucosa, and tongue normal  NECK: supple, thyroid  normal size, non-tender, without nodularity LYMPH:  no palpable lymphadenopathy Enisa the cervical, axillary or inguinal LUNGS: clear to auscultation and percussion with normal breathing effort HEART: regular rate & rhythm and no murmurs and no lower extremity edema ABDOMEN:abdomen soft, non-tender and normal bowel sounds Musculoskeletal:no cyanosis of digits and no clubbing  PSYCH: alert & oriented x 3 with fluent speech NEURO: no focal motor/sensory deficits  LABORATORY DATA:  I have reviewed the data as listed Lab Results  Component Value Date   WBC 6.0 07/30/2024   HGB 13.6 07/30/2024   HCT 40.2 07/30/2024   MCV 92.4 07/30/2024   PLT 223 07/30/2024   No results for input(s): NA, K, CL, CO2, GLUCOSE, BUN, CREATININE, CALCIUM, GFRNONAA, GFRAA, PROT, ALBUMIN, AST, ALT, ALKPHOS, BILITOT, BILIDIR, IBILI Correne the last 8760 hours.  RADIOGRAPHIC STUDIES: I reviewed her bone density scan result from 2024

## 2024-07-31 LAB — RHEUMATOID FACTOR: Rheumatoid fact SerPl-aCnc: <10 [IU]/mL (ref <14.0)

## 2024-08-02 LAB — FANA STAINING PATTERNS: Speckled Pattern: 24529 — ABNORMAL HIGH

## 2024-08-02 LAB — ANTINUCLEAR ANTIBODIES, IFA: ANA Ab, IFA: POSITIVE — AB

## 2024-08-03 LAB — CYCLIC CITRUL PEPTIDE ANTIBODY, IGG/IGA: CCP Antibodies IgG/IgA: 7 U (ref 0–19)

## 2024-08-04 DIAGNOSIS — J3089 Other allergic rhinitis: Secondary | ICD-10-CM | POA: Diagnosis not present

## 2024-08-04 DIAGNOSIS — J301 Allergic rhinitis due to pollen: Secondary | ICD-10-CM | POA: Diagnosis not present

## 2024-08-05 LAB — HEMOCHROMATOSIS DNA-PCR(C282Y,H63D)

## 2024-08-10 ENCOUNTER — Inpatient Hospital Stay (HOSPITAL_BASED_OUTPATIENT_CLINIC_OR_DEPARTMENT_OTHER): Admitting: Hematology and Oncology

## 2024-08-10 ENCOUNTER — Encounter: Payer: Self-pay | Admitting: Hematology and Oncology

## 2024-08-10 ENCOUNTER — Inpatient Hospital Stay

## 2024-08-10 VITALS — BP 137/67 | HR 69 | Temp 97.4°F | Resp 18 | Ht 61.0 in | Wt 94.8 lb

## 2024-08-10 DIAGNOSIS — R7989 Other specified abnormal findings of blood chemistry: Secondary | ICD-10-CM | POA: Diagnosis not present

## 2024-08-10 DIAGNOSIS — J3081 Allergic rhinitis due to animal (cat) (dog) hair and dander: Secondary | ICD-10-CM | POA: Diagnosis not present

## 2024-08-10 DIAGNOSIS — M2550 Pain in unspecified joint: Secondary | ICD-10-CM

## 2024-08-10 DIAGNOSIS — J301 Allergic rhinitis due to pollen: Secondary | ICD-10-CM | POA: Diagnosis not present

## 2024-08-10 DIAGNOSIS — M810 Age-related osteoporosis without current pathological fracture: Secondary | ICD-10-CM | POA: Diagnosis not present

## 2024-08-10 DIAGNOSIS — J3089 Other allergic rhinitis: Secondary | ICD-10-CM | POA: Diagnosis not present

## 2024-08-10 DIAGNOSIS — M6284 Sarcopenia: Secondary | ICD-10-CM | POA: Diagnosis not present

## 2024-08-10 DIAGNOSIS — D8989 Other specified disorders involving the immune mechanism, not elsewhere classified: Secondary | ICD-10-CM

## 2024-08-10 NOTE — Assessment & Plan Note (Addendum)
 Repeat ferritin came back around 250 This is considered normal She does not need phlebotomy Testing for hemochromatosis came back negative

## 2024-08-10 NOTE — Assessment & Plan Note (Addendum)
 I reviewed her documentation On average, she consumed approximately 50 to 60 g of protein which is an adequate I recommend the patient to increase her oral protein intake with supplementation

## 2024-08-10 NOTE — Assessment & Plan Note (Addendum)
 Multifactorial Her sedimentation rate is normal ANA screen came back abnormal I will refer her to rheumatologist for further evaluation

## 2024-08-10 NOTE — Progress Notes (Signed)
 [facility] OFFICE PROGRESS NOTE  Patient Care Team: Valentin Skates, DO as PCP - General (Internal Medicine) Dominica Decamp, NP as Nurse Practitioner (Nurse Practitioner)  Assessment & Plan Autoimmune disorder  Elevated ferritin Repeat ferritin came back around 250 This is considered normal She does not need phlebotomy Testing for hemochromatosis came back negative Arthralgia, unspecified joint Multifactorial Her sedimentation rate is normal ANA screen came back abnormal I will refer her to rheumatologist for further evaluation Sarcopenia I reviewed her documentation On average, she consumed approximately 50 to 60 g of protein which is an adequate I recommend the patient to increase her oral protein intake with supplementation Age-related osteoporosis without current pathological fracture Vitamin D  level is borderline low I recommend the patient to increase vitamin D  supplement and to add component of strength training I recommend the patient to reach out to her gynecologist for hormone replacement therapy  Orders Placed This Encounter  Procedures   Ambulatory referral to Rheumatology    Referral Priority:   Routine    Referral Type:   Consultation    Referral Reason:   Specialty Services Required    Requested Specialty:   Rheumatology    Number of Visits Requested:   1     Katrina Bedford, MD  INTERVAL HISTORY: she returns for surveillance follow-up review of test results I reviewed all the test results with the patient and her husband and spent about 30 minutes on counseling  PHYSICAL EXAMINATION: ECOG PERFORMANCE STATUS: 0 - Asymptomatic  Vitals:   08/10/24 1349  BP: 137/67  Pulse: 69  Resp: 18  Temp: (!) 97.4 F (36.3 C)  SpO2: 100%   Filed Weights   08/10/24 1349  Weight: 94 lb 12.8 oz (43 kg)    Relevant data reviewed during this visit included CBC, sedimentation rate, ANA screen, rheumatoid factor, ferritin level, hemochromatosis gene by  PCR

## 2024-08-10 NOTE — Assessment & Plan Note (Addendum)
 Vitamin D  level is borderline low I recommend the patient to increase vitamin D  supplement and to add component of strength training I recommend the patient to reach out to her gynecologist for hormone replacement therapy

## 2024-08-13 ENCOUNTER — Other Ambulatory Visit

## 2024-08-13 ENCOUNTER — Encounter: Admitting: Hematology and Oncology

## 2024-08-18 DIAGNOSIS — J301 Allergic rhinitis due to pollen: Secondary | ICD-10-CM | POA: Diagnosis not present

## 2024-08-18 DIAGNOSIS — J3081 Allergic rhinitis due to animal (cat) (dog) hair and dander: Secondary | ICD-10-CM | POA: Diagnosis not present

## 2024-08-18 DIAGNOSIS — J3089 Other allergic rhinitis: Secondary | ICD-10-CM | POA: Diagnosis not present

## 2024-08-23 DIAGNOSIS — M810 Age-related osteoporosis without current pathological fracture: Secondary | ICD-10-CM | POA: Diagnosis not present

## 2024-08-23 DIAGNOSIS — N951 Menopausal and female climacteric states: Secondary | ICD-10-CM | POA: Diagnosis not present

## 2024-08-23 DIAGNOSIS — M2550 Pain in unspecified joint: Secondary | ICD-10-CM | POA: Diagnosis not present

## 2024-08-23 DIAGNOSIS — R5383 Other fatigue: Secondary | ICD-10-CM | POA: Diagnosis not present

## 2024-08-25 DIAGNOSIS — J301 Allergic rhinitis due to pollen: Secondary | ICD-10-CM | POA: Diagnosis not present

## 2024-08-25 DIAGNOSIS — J3089 Other allergic rhinitis: Secondary | ICD-10-CM | POA: Diagnosis not present

## 2024-08-25 DIAGNOSIS — J3081 Allergic rhinitis due to animal (cat) (dog) hair and dander: Secondary | ICD-10-CM | POA: Diagnosis not present

## 2024-08-30 DIAGNOSIS — M810 Age-related osteoporosis without current pathological fracture: Secondary | ICD-10-CM | POA: Diagnosis not present

## 2024-09-01 DIAGNOSIS — J3089 Other allergic rhinitis: Secondary | ICD-10-CM | POA: Diagnosis not present

## 2024-09-01 DIAGNOSIS — J301 Allergic rhinitis due to pollen: Secondary | ICD-10-CM | POA: Diagnosis not present

## 2024-09-08 DIAGNOSIS — J3081 Allergic rhinitis due to animal (cat) (dog) hair and dander: Secondary | ICD-10-CM | POA: Diagnosis not present

## 2024-09-08 DIAGNOSIS — J301 Allergic rhinitis due to pollen: Secondary | ICD-10-CM | POA: Diagnosis not present

## 2024-09-08 DIAGNOSIS — J3089 Other allergic rhinitis: Secondary | ICD-10-CM | POA: Diagnosis not present

## 2024-09-12 NOTE — Progress Notes (Unsigned)
 Office Visit Note  Patient: Katrina Robertson             Date of Birth: 1956/05/24           MRN: [id number]             PCP: Valentin Skates, DO Referring: Lonn Hicks, MD Visit Date: 09/13/2024 Occupation: @GUAROCC @  Subjective:  No chief complaint on file.    History of Present Illness: Katrina Robertson is a 68 y.o. female ***   Activities of Daily Living:  Patient reports morning stiffness for *** {minute/hour:19697}.   Patient {ACTIONS;DENIES/REPORTS:21021675::Denies} nocturnal pain.  Difficulty dressing/grooming: {ACTIONS;DENIES/REPORTS:21021675::Denies} Difficulty climbing stairs: {ACTIONS;DENIES/REPORTS:21021675::Denies} Difficulty getting out of chair: {ACTIONS;DENIES/REPORTS:21021675::Denies} Difficulty using hands for taps, buttons, cutlery, and/or writing: {ACTIONS;DENIES/REPORTS:21021675::Denies}  No Rheumatology ROS completed.    Rheum History: # Diagnosed [place] ***.  Manifestation of disease:   Serologies: (+) *** (-) ***  Maintenance Labs: QuantiFERON: *** Hepatitis panel: ***  Current Treatment ***  Prior Treatments ***   PMFS History:  Patient Active Problem List   Diagnosis Date Noted   Joint pain 07/30/2024   Elevated ferritin 07/30/2024   Sarcopenia 07/30/2024   Osteoporosis    Dysphagia 01/08/2023   Special screening for malignant neoplasms, colon 01/08/2023   Gastroesophageal reflux disease 01/08/2023    Past Medical History:  Diagnosis Date   Allergic rhinitis    Deviated septum    GERD (gastroesophageal reflux disease) 2023   Hyperlipidemia    Hypertension 2000   Osteopenia    Osteoporosis     Family History  Problem Relation Age of Onset   Stroke Father    Cancer Sister    Colon cancer Neg Hx    Stomach cancer Neg Hx    Esophageal cancer Neg Hx    Rectal cancer Neg Hx    Colon polyps Neg Hx    Past Surgical History:  Procedure Laterality Date   THYROIDECTOMY, PARTIAL  1990   TOTAL ABDOMINAL HYSTERECTOMY   1996   Social History   Social History Narrative   Lives with husband    caffeine- coffee 1 cup daily    There is no immunization history on file for this patient.   Objective: Vital Signs: There were no vitals taken for this visit.   Physical Exam Vitals and nursing note reviewed.  HENT:     Head: Normocephalic and atraumatic.     Nose: Nose normal.  Eyes:     Conjunctiva/sclera: Conjunctivae normal.     Pupils: Pupils are equal, round, and reactive to light.  Cardiovascular:     Rate and Rhythm: Normal rate and regular rhythm.     Heart sounds: Normal heart sounds.  Pulmonary:     Effort: Pulmonary effort is normal.     Breath sounds: Normal breath sounds.  Skin:    General: Skin is warm and dry.  Neurological:     Mental Status: She is alert. Mental status is at baseline.  Psychiatric:        Mood and Affect: Mood normal.        Behavior: Behavior normal.      Musculoskeletal Exam: ***  CDAI Exam: CDAI Score: -- Patient Global: --; Provider Global: -- Swollen: --; Tender: -- Joint Exam 09/13/2024   No joint exam has been documented for this visit   There is currently no information documented on the homunculus. Go to the Rheumatology activity and complete the homunculus joint exam.  Investigation: No additional findings.  Imaging:  No results found.  Recent Labs: Lab Results  Component Value Date   WBC 6.0 07/30/2024   HGB 13.6 07/30/2024   PLT 223 07/30/2024   Lab Results  Component Value Date   ANA Positive (A) 07/30/2024   RF <10.0 07/30/2024    Speciality Comments: No specialty comments available.  Procedures:  No procedures performed Allergies: Cefdinir, Gramineae pollens, Penicillins, and Sulfa antibiotics   Assessment / Plan:     Visit Diagnoses: No diagnosis found.  #High risk medication use  Orders: No orders of the defined types were placed Parveen this encounter.  No orders of the defined types were placed Nakisha this  encounter.   I personally spent a total of *** minutes Anastazja the care of the patient today including {Time Based Coding:210964241}.  Follow-Up Instructions: No follow-ups on file.   Asberry Claw, DO

## 2024-09-13 ENCOUNTER — Ambulatory Visit

## 2024-09-13 ENCOUNTER — Ambulatory Visit: Admission: RE | Admit: 2024-09-13 | Discharge: 2024-09-13 | Disposition: A | Source: Ambulatory Visit

## 2024-09-13 VITALS — BP 112/75 | HR 65 | Temp 97.8°F | Resp 12 | Ht 60.75 in | Wt 98.6 lb

## 2024-09-13 DIAGNOSIS — R7689 Other specified abnormal immunological findings in serum: Secondary | ICD-10-CM

## 2024-09-13 DIAGNOSIS — M25531 Pain in right wrist: Secondary | ICD-10-CM

## 2024-09-13 DIAGNOSIS — Z1231 Encounter for screening mammogram for malignant neoplasm of breast: Secondary | ICD-10-CM | POA: Diagnosis not present

## 2024-09-14 LAB — C3 AND C4
C3 Complement: 100 mg/dL (ref 83–193)
C4 Complement: 24 mg/dL (ref 15–57)

## 2024-09-14 LAB — PROTEIN / CREATININE RATIO, URINE
Creatinine, Urine: 57 mg/dL (ref 20–275)
Protein/Creat Ratio: 105 mg/g{creat} (ref 24–184)
Protein/Creatinine Ratio: 0.105 mg/mg{creat} (ref 0.024–0.184)
Total Protein, Urine: 6 mg/dL (ref 5–24)

## 2024-09-14 LAB — ANTI-DNA ANTIBODY, DOUBLE-STRANDED: ds DNA Ab: 1 [IU]/mL

## 2024-09-14 LAB — ANTI-SMITH ANTIBODY: ENA SM Ab Ser-aCnc: <1 AI

## 2024-09-14 LAB — SJOGREN'S SYNDROME ANTIBODS(SSA + SSB)
SSA (Ro) (ENA) Antibody, IgG: <1 AI
SSB (La) (ENA) Antibody, IgG: <1 AI

## 2024-09-15 DIAGNOSIS — J301 Allergic rhinitis due to pollen: Secondary | ICD-10-CM | POA: Diagnosis not present

## 2024-09-15 DIAGNOSIS — J3089 Other allergic rhinitis: Secondary | ICD-10-CM | POA: Diagnosis not present

## 2024-09-18 ENCOUNTER — Ambulatory Visit: Payer: Self-pay

## 2024-09-22 DIAGNOSIS — J301 Allergic rhinitis due to pollen: Secondary | ICD-10-CM | POA: Diagnosis not present

## 2024-09-23 DIAGNOSIS — J3089 Other allergic rhinitis: Secondary | ICD-10-CM | POA: Diagnosis not present

## 2024-09-23 DIAGNOSIS — J301 Allergic rhinitis due to pollen: Secondary | ICD-10-CM | POA: Diagnosis not present

## 2024-09-23 DIAGNOSIS — J3081 Allergic rhinitis due to animal (cat) (dog) hair and dander: Secondary | ICD-10-CM | POA: Diagnosis not present

## 2024-09-30 DIAGNOSIS — J3081 Allergic rhinitis due to animal (cat) (dog) hair and dander: Secondary | ICD-10-CM | POA: Diagnosis not present

## 2024-09-30 DIAGNOSIS — J3089 Other allergic rhinitis: Secondary | ICD-10-CM | POA: Diagnosis not present

## 2024-09-30 DIAGNOSIS — J301 Allergic rhinitis due to pollen: Secondary | ICD-10-CM | POA: Diagnosis not present

## 2024-10-06 DIAGNOSIS — J301 Allergic rhinitis due to pollen: Secondary | ICD-10-CM | POA: Diagnosis not present

## 2024-10-06 DIAGNOSIS — J3081 Allergic rhinitis due to animal (cat) (dog) hair and dander: Secondary | ICD-10-CM | POA: Diagnosis not present

## 2024-10-06 DIAGNOSIS — J3089 Other allergic rhinitis: Secondary | ICD-10-CM | POA: Diagnosis not present

## 2024-10-07 DIAGNOSIS — H401131 Primary open-angle glaucoma, bilateral, mild stage: Secondary | ICD-10-CM | POA: Diagnosis not present

## 2024-10-13 DIAGNOSIS — J301 Allergic rhinitis due to pollen: Secondary | ICD-10-CM | POA: Diagnosis not present

## 2024-10-13 DIAGNOSIS — J3089 Other allergic rhinitis: Secondary | ICD-10-CM | POA: Diagnosis not present

## 2024-10-13 DIAGNOSIS — J3081 Allergic rhinitis due to animal (cat) (dog) hair and dander: Secondary | ICD-10-CM | POA: Diagnosis not present

## 2024-10-19 DIAGNOSIS — J301 Allergic rhinitis due to pollen: Secondary | ICD-10-CM | POA: Diagnosis not present

## 2024-10-19 DIAGNOSIS — J3089 Other allergic rhinitis: Secondary | ICD-10-CM | POA: Diagnosis not present

## 2024-10-20 DIAGNOSIS — J343 Hypertrophy of nasal turbinates: Secondary | ICD-10-CM | POA: Diagnosis not present

## 2024-10-20 DIAGNOSIS — J3489 Other specified disorders of nose and nasal sinuses: Secondary | ICD-10-CM | POA: Diagnosis not present

## 2024-10-25 ENCOUNTER — Encounter

## 2024-10-26 DIAGNOSIS — J301 Allergic rhinitis due to pollen: Secondary | ICD-10-CM | POA: Diagnosis not present

## 2024-10-26 DIAGNOSIS — J3081 Allergic rhinitis due to animal (cat) (dog) hair and dander: Secondary | ICD-10-CM | POA: Diagnosis not present

## 2024-10-26 DIAGNOSIS — J3089 Other allergic rhinitis: Secondary | ICD-10-CM | POA: Diagnosis not present
